# Patient Record
Sex: Female | Born: 1955
Health system: Southern US, Community
[De-identification: ages and names within clinical notes are randomized; demographics above are authoritative.]

## PROBLEM LIST (undated history)

## (undated) DIAGNOSIS — F419 Anxiety disorder, unspecified: Secondary | ICD-10-CM

## (undated) DIAGNOSIS — J45909 Unspecified asthma, uncomplicated: Secondary | ICD-10-CM

## (undated) DIAGNOSIS — E785 Hyperlipidemia, unspecified: Secondary | ICD-10-CM

## (undated) DIAGNOSIS — E039 Hypothyroidism, unspecified: Secondary | ICD-10-CM

## (undated) DIAGNOSIS — I10 Essential (primary) hypertension: Secondary | ICD-10-CM

## (undated) DIAGNOSIS — R0600 Dyspnea, unspecified: Secondary | ICD-10-CM

## (undated) DIAGNOSIS — M1990 Unspecified osteoarthritis, unspecified site: Secondary | ICD-10-CM

## (undated) DIAGNOSIS — J302 Other seasonal allergic rhinitis: Secondary | ICD-10-CM

## (undated) DIAGNOSIS — H269 Unspecified cataract: Secondary | ICD-10-CM

## (undated) DIAGNOSIS — M85851 Other specified disorders of bone density and structure, right thigh: Secondary | ICD-10-CM

## (undated) DIAGNOSIS — R7303 Prediabetes: Secondary | ICD-10-CM

## (undated) HISTORY — DX: Unspecified cataract: H26.9

## (undated) HISTORY — PX: LASIK: SHX215

## (undated) HISTORY — PX: VITRECTOMY: SHX106

## (undated) HISTORY — DX: Hyperlipidemia, unspecified: E78.5

## (undated) HISTORY — PX: BREAST SURGERY: SHX581

## (undated) HISTORY — DX: Essential (primary) hypertension: I10

## (undated) HISTORY — PX: CATARACT EXTRACTION: SUR2

## (undated) HISTORY — DX: Other specified disorders of bone density and structure, right thigh: M85.851

---
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
## 2001-12-06 ENCOUNTER — Encounter: Payer: Self-pay | Admitting: Family Medicine

## 2001-12-06 ENCOUNTER — Encounter: Admission: RE | Admit: 2001-12-06 | Discharge: 2001-12-06 | Payer: Self-pay | Admitting: Family Medicine

## 2003-09-28 ENCOUNTER — Encounter: Admission: RE | Admit: 2003-09-28 | Discharge: 2003-09-28 | Payer: Self-pay | Admitting: Internal Medicine

## 2006-03-21 ENCOUNTER — Encounter: Admission: RE | Admit: 2006-03-21 | Discharge: 2006-03-21 | Payer: Self-pay | Admitting: Obstetrics & Gynecology

## 2008-05-01 ENCOUNTER — Encounter: Admission: RE | Admit: 2008-05-01 | Discharge: 2008-05-01 | Payer: Self-pay | Admitting: Family Medicine

## 2008-05-01 IMAGING — CT CT NECK W/ CM
3 of 4 series · 13 of 20 positions shown, 15 images · IV contrast ([ID] OMNI 300)
Comparison: None available.

CLINICAL DATA: Right posterior cervical swelling and pain times 4
months.

CT NECK WITH CONTRAST
TECHNIQUE: Multidetector CT imaging of the neck was performed with
intravenous contrast.
Contrast: 100 ml Omnipaque 300.

[Series 2: neck w/ · axial · 0.39mm/px · z∈[-107,+77]mm · 8 of 63 slices shown, 10 images]
[im 7/63  soft-tissue]
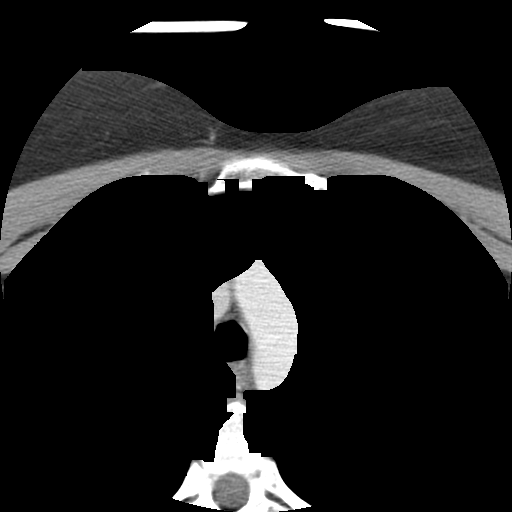
[im 7/63  bone]
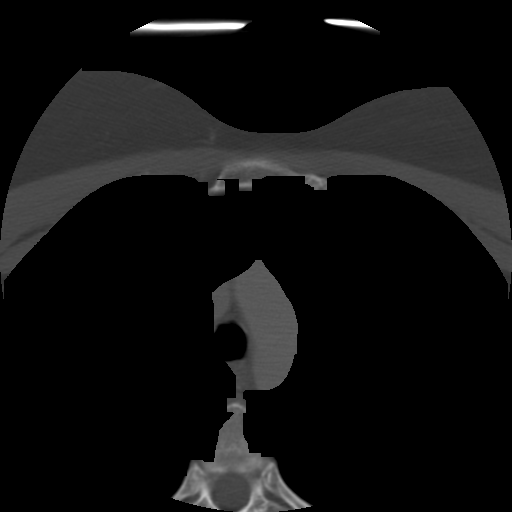
[im 14/63  bone]
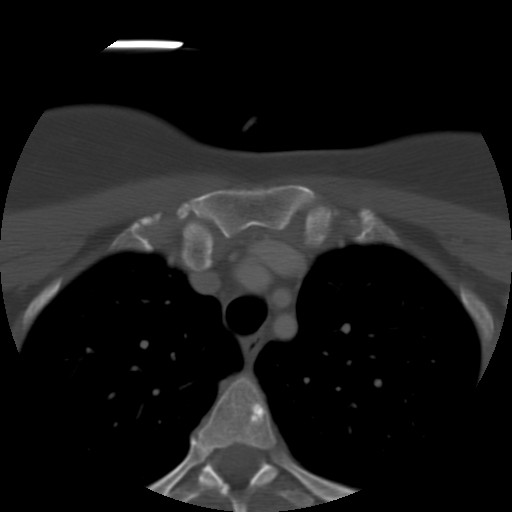
[im 21/63  bone]
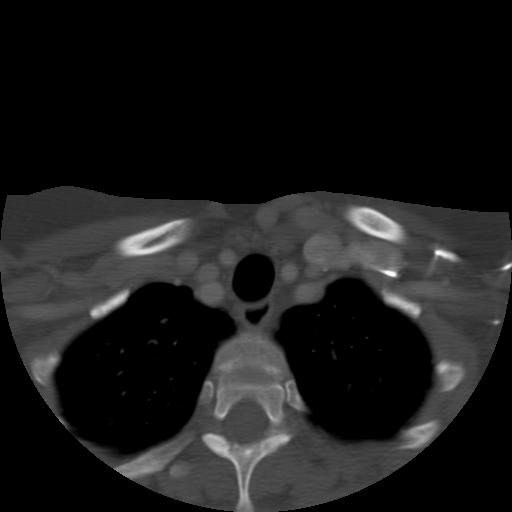
[im 28/63  bone]
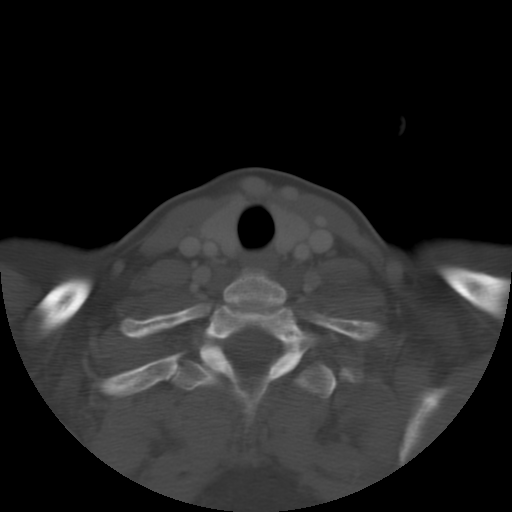
[im 35/63  soft-tissue]
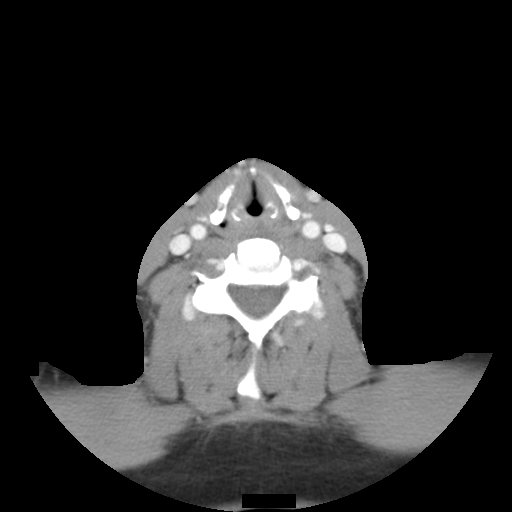
[im 35/63  bone]
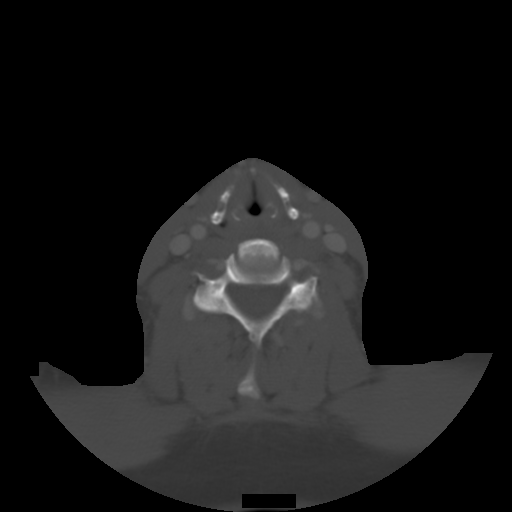
[im 42/63  bone]
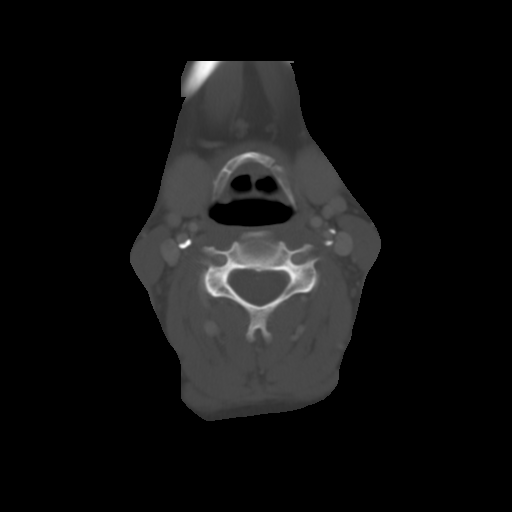
[im 49/63  bone]
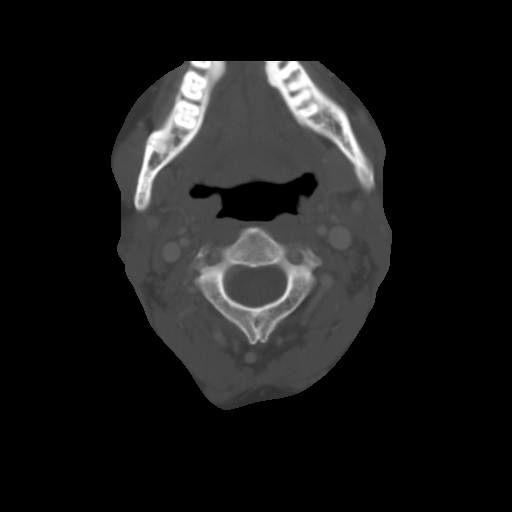
[im 56/63  bone]
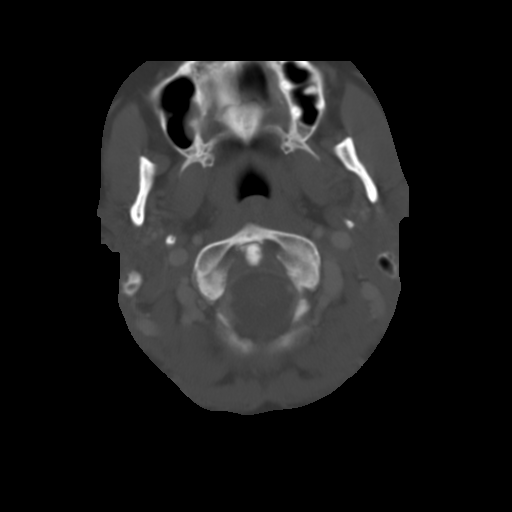

[Series 3: lung windows · axial · 0.53mm/px · z∈[-94,-59]mm · 2 of 23 slices shown]
[im 8/23  bone]
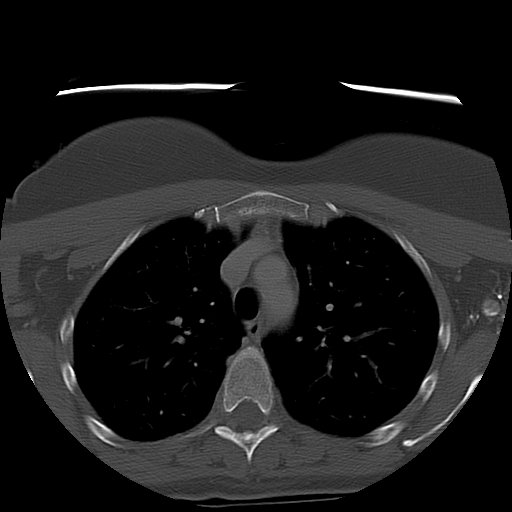
[im 15/23  bone]
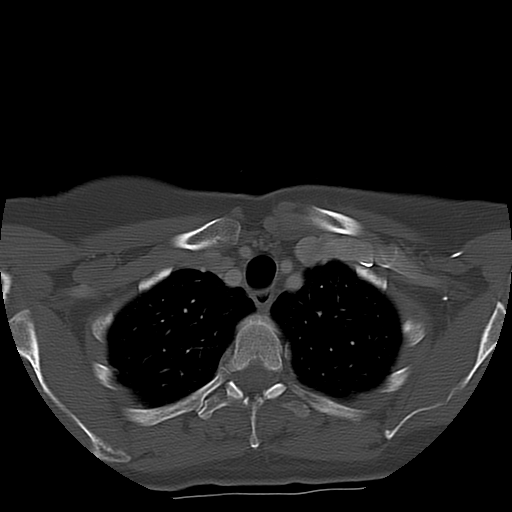

[Series 400: coronal · coronal · 0.47mm/px · 3 of 83 slices shown]
[im 7/83  bone]
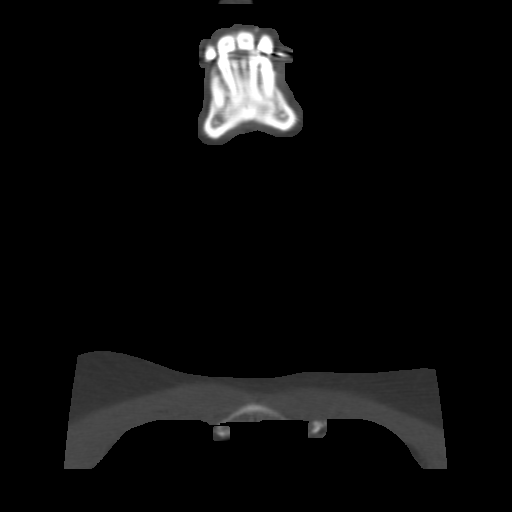
[im 42/83  bone]
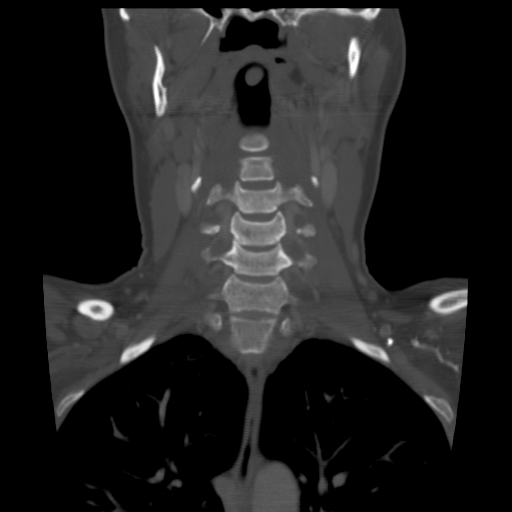
[im 77/83  bone]
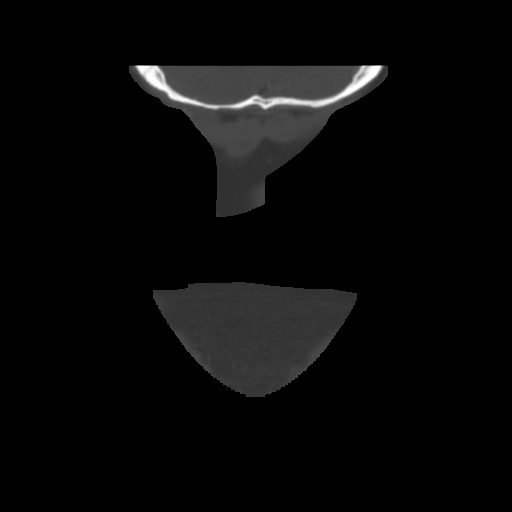

[13 of 20 positions shown; findings below may reference images not displayed]

FINDINGS: No focal mucosal or submucosal lesions are evident.  Rule
prominent level II jugulodigastric nodes are seen bilaterally.
These are likely within normal limits.  Posterior lymph nodes are
small and within normal limits.  There is no focal mass or
significant inflammation.  Muscles are within normal limits.  Mild
degenerative changes are seen in the mid cervical spine.
Atherosclerotic calcification is seen in the proximal internal
carotid arteries bilaterally.  Limited imaging of the brain is
unremarkable.  The lung apices are clear. A well-defined 8 mm
sclerotic lesion is seen within the sternum.  This is mildly
spiculated but contained.  The overlying cortices are normal.
IMPRESSION: 1.  No focal mass or soft tissue induration to explain the findings
or symptoms.
2.  Small lymph nodes are likely within normal limits bilaterally.
3.  Atherosclerotic calcifications within the proximal internal
carotid arteries bilaterally.
4.  Well-defined sclerotic lesion most compatible with a bone
island.

## 2013-06-19 HISTORY — PX: COLONOSCOPY: SHX174

## 2013-10-13 ENCOUNTER — Encounter (HOSPITAL_COMMUNITY): Payer: Self-pay | Admitting: Pharmacy Technician

## 2013-10-22 ENCOUNTER — Ambulatory Visit (HOSPITAL_COMMUNITY): Admission: RE | Admit: 2013-10-22 | Payer: Self-pay | Source: Ambulatory Visit | Admitting: Gastroenterology

## 2013-10-22 ENCOUNTER — Encounter (HOSPITAL_COMMUNITY): Admission: RE | Payer: Self-pay | Source: Ambulatory Visit

## 2013-10-22 SURGERY — COLONOSCOPY WITH PROPOFOL
Anesthesia: Monitor Anesthesia Care

## 2013-10-29 ENCOUNTER — Encounter (HOSPITAL_COMMUNITY): Payer: Self-pay | Admitting: *Deleted

## 2013-11-03 ENCOUNTER — Other Ambulatory Visit: Payer: Self-pay | Admitting: Gastroenterology

## 2013-11-04 ENCOUNTER — Encounter (HOSPITAL_COMMUNITY): Payer: 59 | Admitting: Anesthesiology

## 2013-11-04 ENCOUNTER — Ambulatory Visit (HOSPITAL_COMMUNITY)
Admission: RE | Admit: 2013-11-04 | Discharge: 2013-11-04 | Disposition: A | Discharge status: Home / Self Care | Payer: 59 | Source: Ambulatory Visit | Attending: Gastroenterology | Admitting: Gastroenterology

## 2013-11-04 ENCOUNTER — Encounter (HOSPITAL_COMMUNITY): Payer: Self-pay

## 2013-11-04 ENCOUNTER — Encounter (HOSPITAL_COMMUNITY)
Admission: RE | Disposition: A | Discharge status: Home / Self Care | Payer: Self-pay | Source: Ambulatory Visit | Attending: Gastroenterology

## 2013-11-04 ENCOUNTER — Ambulatory Visit (HOSPITAL_COMMUNITY): Payer: 59 | Admitting: Anesthesiology

## 2013-11-04 DIAGNOSIS — Z87891 Personal history of nicotine dependence: Secondary | ICD-10-CM | POA: Insufficient documentation

## 2013-11-04 DIAGNOSIS — E039 Hypothyroidism, unspecified: Secondary | ICD-10-CM | POA: Insufficient documentation

## 2013-11-04 DIAGNOSIS — Z881 Allergy status to other antibiotic agents status: Secondary | ICD-10-CM | POA: Insufficient documentation

## 2013-11-04 DIAGNOSIS — Z1211 Encounter for screening for malignant neoplasm of colon: Secondary | ICD-10-CM | POA: Insufficient documentation

## 2013-11-04 DIAGNOSIS — Z79899 Other long term (current) drug therapy: Secondary | ICD-10-CM | POA: Insufficient documentation

## 2013-11-04 DIAGNOSIS — Z860100 Personal history of colon polyps, unspecified: Secondary | ICD-10-CM | POA: Insufficient documentation

## 2013-11-04 DIAGNOSIS — Z7982 Long term (current) use of aspirin: Secondary | ICD-10-CM | POA: Insufficient documentation

## 2013-11-04 HISTORY — PX: COLONOSCOPY WITH PROPOFOL: SHX5780

## 2013-11-04 HISTORY — DX: Other seasonal allergic rhinitis: J30.2

## 2013-11-04 HISTORY — DX: Hypothyroidism, unspecified: E03.9

## 2013-11-04 SURGERY — COLONOSCOPY WITH PROPOFOL
Anesthesia: Monitor Anesthesia Care

## 2013-11-04 MED ORDER — SODIUM CHLORIDE 0.9 % IV SOLN: INTRAVENOUS | Status: DC

## 2013-11-04 MED ORDER — LACTATED RINGERS IV SOLN
INTRAVENOUS | Status: DC
  Administered 2013-11-04: 1000 mL via INTRAVENOUS
  Administered 2013-11-04: 08:00:00 via INTRAVENOUS

## 2013-11-04 MED ORDER — MIDAZOLAM HCL 5 MG/5ML IJ SOLN
INTRAMUSCULAR | Status: DC | PRN
  Administered 2013-11-04 (×2): 1 mg via INTRAVENOUS

## 2013-11-04 MED ORDER — MIDAZOLAM HCL 2 MG/2ML IJ SOLN
INTRAMUSCULAR | Status: AC
  Filled 2013-11-04: qty 2

## 2013-11-04 MED ORDER — KETAMINE HCL 10 MG/ML IJ SOLN
INTRAMUSCULAR | Status: DC | PRN
  Administered 2013-11-04: 40 mg via INTRAVENOUS

## 2013-11-04 MED ORDER — PROPOFOL 10 MG/ML IV BOLUS
INTRAVENOUS | Status: AC
Start: 2013-11-04 — End: 2013-11-04
  Filled 2013-11-04: qty 20

## 2013-11-04 MED ORDER — PROPOFOL 10 MG/ML IV BOLUS
INTRAVENOUS | Status: AC
  Filled 2013-11-04: qty 20

## 2013-11-04 MED ORDER — PROPOFOL INFUSION 10 MG/ML OPTIME
INTRAVENOUS | Status: DC | PRN
  Administered 2013-11-04: 140 ug/kg/min via INTRAVENOUS

## 2013-11-04 MED ORDER — FENTANYL CITRATE 0.05 MG/ML IJ SOLN
INTRAMUSCULAR | Status: AC
  Filled 2013-11-04: qty 2

## 2013-11-04 MED ORDER — FENTANYL CITRATE 0.05 MG/ML IJ SOLN
INTRAMUSCULAR | Status: DC | PRN
  Administered 2013-11-04 (×2): 50 ug via INTRAVENOUS

## 2013-11-04 SURGICAL SUPPLY — 22 items

## 2013-11-04 NOTE — Op Note (Signed)
[facility] [place], [address]   COLONOSCOPY PROCEDURE REPORT  PATIENT: Madeline Garza, Madeline Garza  MR#: [id number] BIRTHDATE: 1956-02-26 , 54  yrs. old GENDER: Female ENDOSCOPIST: Ronald Lobo, MD REFERRED BY:   Darcus Austin, MD PROCEDURE DATE:  11/04/2013 PROCEDURE:     colonoscopy ASA CLASS: INDICATIONS:  surveillance for a 6 mm adenomatous polyp removed 5 years ago on the patient's initial screening colonoscopy, which was technically difficult due to to colonic anatomy  MEDICATIONS:    MAC per anesthesia, including propofol, ketamine, fentanyl, and Versed  DESCRIPTION OF PROCEDURE:  The patient came as an outpatient to the [facility] long endoscopy unit and provided written consent. Time out was performed.  The patient received the above sedation and remained stable throughout the procedure.  Based on prior experience, I felt the patient would do best with the Pentax adult video colonoscope, and indeed that scope was advanced all the way to the terminal ileum without significant difficulty.  The terminal ileum looked normal.  The quality of the prep of the colon was excellent it is felt that all areas were well seen.  This was a normal examination. No recurrent polyps, nor any masses, diverticular disease, colitis, or vascular malformations were noted. Retroflexion in the rectum and reinspection of the rectum were unremarkable.  The patient tolerated the procedure well. No biopsies were obtained.     COMPLICATIONS: None  ENDOSCOPIC IMPRESSION:  1.  normal surveillance colonoscopy in a patient with a prior history of a solitary diminutive adenomatous polyp  RECOMMENDATIONS:  1. Per current guidelines for polyp surveillance, this patient should have surveillance colonoscopy 10 years from now    _______________________________ eSigned:  Ronald Lobo, MD 11/04/2013 10:09 AM

## 2013-11-04 NOTE — Transfer of Care (Signed)
Immediate Anesthesia Transfer of Care Note  Patient: Madeline Garza  Procedure(s) Performed: Procedure(s) with comments: COLONOSCOPY WITH PROPOFOL (N/A) - ultraslim scope  Patient Location: PACU  Anesthesia Type:MAC  Level of Consciousness: awake  Airway & Oxygen Therapy: Patient Spontanous Breathing and Patient connected to face mask oxygen  Post-op Assessment: Report given to PACU RN and Post -op Vital signs reviewed and stable  Post vital signs: Reviewed and stable  Complications: No apparent anesthesia complications

## 2013-11-04 NOTE — Addendum Note (Signed)
Addendum created 11/04/13 1200 by Camillia Marcy L Janna Oak, CRNA   Modules edited: Charges VN    

## 2013-11-04 NOTE — Anesthesia Postprocedure Evaluation (Signed)
  Anesthesia Post-op Note  Patient: Madeline Garza  Procedure(s) Performed: Procedure(s) (LRB): COLONOSCOPY WITH PROPOFOL (N/A)  Patient Location: PACU  Anesthesia Type: MAC  Level of Consciousness: awake and alert   Airway and Oxygen Therapy: Patient Spontanous Breathing  Post-op Pain: mild  Post-op Assessment: Post-op Vital signs reviewed, Patient's Cardiovascular Status Stable, Respiratory Function Stable, Patent Airway and No signs of Nausea or vomiting  Last Vitals:  Filed Vitals:   11/04/13 1000  BP: 130/62  Pulse: 71  Temp: 36.4 C  Resp: 18    Post-op Vital Signs: stable   Complications: No apparent anesthesia complications

## 2013-11-04 NOTE — Anesthesia Preprocedure Evaluation (Signed)
Anesthesia Evaluation  Patient identified by MRN, date of birth, ID band Patient awake    Reviewed: Allergy & Precautions, H&P , NPO status , Patient's Chart, lab work & pertinent test results  Airway Mallampati: II TM Distance: >3 FB Neck ROM: Full    Dental no notable dental hx.    Pulmonary neg pulmonary ROS, former smoker,  breath sounds clear to auscultation  Pulmonary exam normal       Cardiovascular negative cardio ROS  Rhythm:Regular Rate:Normal     Neuro/Psych negative neurological ROS  negative psych ROS   GI/Hepatic negative GI ROS, Neg liver ROS,   Endo/Other  Hypothyroidism   Renal/GU negative Renal ROS  negative genitourinary   Musculoskeletal negative musculoskeletal ROS (+)   Abdominal   Peds negative pediatric ROS (+)  Hematology negative hematology ROS (+)   Anesthesia Other Findings   Reproductive/Obstetrics negative OB ROS                           Anesthesia Physical Anesthesia Plan  ASA: II  Anesthesia Plan: MAC   Post-op Pain Management:    Induction: Intravenous  Airway Management Planned: Nasal Cannula  Additional Equipment:   Intra-op Plan:   Post-operative Plan:   Informed Consent: I have reviewed the patients History and Physical, chart, labs and discussed the procedure including the risks, benefits and alternatives for the proposed anesthesia with the patient or authorized representative who has indicated his/her understanding and acceptance.     Plan Discussed with: CRNA and Surgeon  Anesthesia Plan Comments:         Anesthesia Quick Evaluation

## 2013-11-04 NOTE — H&P (Signed)
Madeline Garza is an 58 y.o. female.   Chief Complaint: History of colon polyp HPI: This 58 year old female is 5 years status post removal of a 6 mm benign adenoma from the transverse colon. She presents now for routine surveillance colonoscopy. The procedure is being done at the hospital because of technical difficulty getting around her colon due to colonic anatomy, including fixation and looping, at the time of her last procedure. It was felt that propofol sedation, with potential need for the ultraslim colonoscope, would improve the chances of getting around the colon more easily  Past Medical History  Diagnosis Date  . Hypothyroidism   . Seasonal allergies     Past Surgical History  Procedure Laterality Date  . Breast surgery      age 85 breast cyst-benign  . Lasik      History reviewed. No pertinent family history. Social History:  reports that she quit smoking about 5 years ago. Her smoking use included Cigarettes. She smoked 0.00 packs per day. She does not have any smokeless tobacco history on file. She reports that she drinks alcohol. She reports that she does not use illicit drugs.  Allergies:  Allergies  Allergen Reactions  . Tetracyclines & Related Nausea And Vomiting    Medications Prior to Admission  Medication Sig Dispense Refill  . aspirin EC 81 MG tablet Take 81 mg by mouth daily.      . B Complex-C (B-COMPLEX WITH VITAMIN C) tablet Take 1 tablet by mouth daily.      . cholecalciferol (VITAMIN D) 1000 UNITS tablet Take 1,000 Units by mouth daily.      Marland Kitchen KRILL OIL PO Take 1 capsule by mouth daily.       Marland Kitchen levothyroxine (SYNTHROID, LEVOTHROID) 25 MCG tablet Take 25 mcg by mouth daily before breakfast.      . Multiple Vitamin (MULTIVITAMIN WITH MINERALS) TABS tablet Take 1 tablet by mouth daily.      Marland Kitchen Bioflavonoid Products (GRAPE SEED PO) Take 1 tablet by mouth daily.        No results found for this or any previous visit (from the past 48 hour(s)). No results  found.  ROS negative  Blood pressure 132/59, pulse 60, temperature 97.7 F (36.5 C), temperature source Oral, resp. rate 18, height 5\' 4"  (1.626 m), weight 69.4 kg (153 lb), SpO2 100.00%. Physical Exam a healthy appearing patient in no evident distress. The chest is clear. The heart is without murmurs or arrhythmias. The abdomen is soft, nontender, and without guarding or masses. Oropharynx benign  Assessment/Plan History of colonic adenoma, now due for surveillance.  Plan: Colonoscopy under propofol sedation. We will start with the adult colonoscope and potentially move to the ultraslim colonoscope if needed.  Youlanda Mighty Maythe Deramo 11/04/2013, 9:22 AM

## 2013-11-05 ENCOUNTER — Encounter (HOSPITAL_COMMUNITY): Payer: Self-pay | Admitting: Gastroenterology

## 2014-05-28 ENCOUNTER — Ambulatory Visit (INDEPENDENT_AMBULATORY_CARE_PROVIDER_SITE_OTHER): Payer: 59 | Admitting: Obstetrics and Gynecology

## 2014-05-28 ENCOUNTER — Encounter: Payer: Self-pay | Admitting: Obstetrics and Gynecology

## 2014-05-28 VITALS — BP 110/62 | HR 80 | Resp 16 | Ht 64.25 in | Wt 165.0 lb

## 2014-05-28 DIAGNOSIS — Z0000 Encounter for general adult medical examination without abnormal findings: Secondary | ICD-10-CM

## 2014-05-28 DIAGNOSIS — Z01419 Encounter for gynecological examination (general) (routine) without abnormal findings: Secondary | ICD-10-CM

## 2014-05-28 LAB — POCT URINALYSIS DIPSTICK
Bilirubin, UA: NEGATIVE
Blood, UA: NEGATIVE
GLUCOSE UA: NEGATIVE
Ketones, UA: NEGATIVE
LEUKOCYTES UA: NEGATIVE
Nitrite, UA: NEGATIVE
PROTEIN UA: NEGATIVE
UROBILINOGEN UA: NEGATIVE
pH, UA: 6

## 2014-05-28 NOTE — Patient Instructions (Signed)
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
## 2014-05-31 LAB — IPS PAP TEST WITH HPV

## 2015-06-02 ENCOUNTER — Ambulatory Visit: Payer: 59 | Admitting: Obstetrics and Gynecology

## 2015-07-15 ENCOUNTER — Ambulatory Visit: Payer: 59 | Admitting: Obstetrics and Gynecology

## 2015-08-24 ENCOUNTER — Emergency Department (HOSPITAL_COMMUNITY)
Admission: EM | Admit: 2015-08-24 | Discharge: 2015-08-24 | Disposition: A | Discharge status: Home / Self Care | Payer: Self-pay | Attending: Emergency Medicine | Admitting: Emergency Medicine

## 2015-08-24 ENCOUNTER — Encounter (HOSPITAL_COMMUNITY): Payer: Self-pay | Admitting: *Deleted

## 2015-08-24 DIAGNOSIS — I10 Essential (primary) hypertension: Secondary | ICD-10-CM | POA: Insufficient documentation

## 2015-08-24 DIAGNOSIS — Z87891 Personal history of nicotine dependence: Secondary | ICD-10-CM | POA: Insufficient documentation

## 2015-08-24 DIAGNOSIS — Z7982 Long term (current) use of aspirin: Secondary | ICD-10-CM | POA: Insufficient documentation

## 2015-08-24 DIAGNOSIS — R002 Palpitations: Secondary | ICD-10-CM | POA: Insufficient documentation

## 2015-08-24 DIAGNOSIS — Z79899 Other long term (current) drug therapy: Secondary | ICD-10-CM | POA: Insufficient documentation

## 2015-08-24 DIAGNOSIS — E039 Hypothyroidism, unspecified: Secondary | ICD-10-CM | POA: Insufficient documentation

## 2015-08-24 DIAGNOSIS — Z87828 Personal history of other (healed) physical injury and trauma: Secondary | ICD-10-CM | POA: Insufficient documentation

## 2015-08-24 LAB — BASIC METABOLIC PANEL
Anion gap: 16 — ABNORMAL HIGH (ref 5–15)
BUN: 8 mg/dL (ref 6–20)
CO2: 26 mmol/L (ref 22–32)
Calcium: 9.7 mg/dL (ref 8.9–10.3)
Chloride: 92 mmol/L — ABNORMAL LOW (ref 101–111)
Creatinine, Ser: 0.61 mg/dL (ref 0.44–1.00)
GFR calc Af Amer: >60 mL/min (ref >60)
GFR calc non Af Amer: >60 mL/min (ref >60)
Glucose, Bld: 103 mg/dL — ABNORMAL HIGH (ref 65–99)
Potassium: 2.9 mmol/L — ABNORMAL LOW (ref 3.5–5.1)
Sodium: 134 mmol/L — ABNORMAL LOW (ref 135–145)

## 2015-08-24 LAB — URINALYSIS, ROUTINE W REFLEX MICROSCOPIC
Bilirubin Urine: NEGATIVE
Glucose, UA: NEGATIVE mg/dL
Hgb urine dipstick: NEGATIVE
Ketones, ur: 15 mg/dL — AB
Leukocytes, UA: NEGATIVE
Nitrite: NEGATIVE
Protein, ur: NEGATIVE mg/dL
Specific Gravity, Urine: 1.01 (ref 1.005–1.030)
pH: 6.5 (ref 5.0–8.0)

## 2015-08-24 LAB — CBC WITH DIFFERENTIAL/PLATELET
Basophils Absolute: 0 10*3/uL (ref 0.0–0.1)
Basophils Relative: 0 %
Eosinophils Absolute: 0 10*3/uL (ref 0.0–0.7)
Eosinophils Relative: 0 %
HCT: 41.2 % (ref 36.0–46.0)
Hemoglobin: 14 g/dL (ref 12.0–15.0)
Lymphocytes Relative: 25 %
Lymphs Abs: 1.3 10*3/uL (ref 0.7–4.0)
MCH: 30.1 pg (ref 26.0–34.0)
MCHC: 34 g/dL (ref 30.0–36.0)
MCV: 88.6 fL (ref 78.0–100.0)
Monocytes Absolute: 0.4 10*3/uL (ref 0.1–1.0)
Monocytes Relative: 8 %
Neutro Abs: 3.5 10*3/uL (ref 1.7–7.7)
Neutrophils Relative %: 67 %
Platelets: 288 10*3/uL (ref 150–400)
RBC: 4.65 MIL/uL (ref 3.87–5.11)
RDW: 12.9 % (ref 11.5–15.5)
WBC: 5.2 10*3/uL (ref 4.0–10.5)

## 2015-08-24 MED ORDER — POTASSIUM CHLORIDE ER 10 MEQ PO TBCR: 10.0000 meq | EXTENDED_RELEASE_TABLET | Freq: Every day | ORAL | Status: DC

## 2015-08-24 MED ORDER — POTASSIUM CHLORIDE CRYS ER 20 MEQ PO TBCR
60.0000 meq | EXTENDED_RELEASE_TABLET | Freq: Once | ORAL | Status: AC
  Administered 2015-08-24: 60 meq via ORAL
  Filled 2015-08-24: qty 3
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
## 2015-08-24 NOTE — ED Notes (Signed)
Pt was at work and began to have palpitations. She checked her BP it was over [id number] systolic.  She called EMS to bring her to ED.  Pt was only taking synthroid until Saturday when she was prescribed  hydrochlorothiazide for Hypertension, dizziness, nausea, and headaches. She has felt fine until the past 2 days.  Today was the worst day so she came in for evaluation.  Per EMS vital signs are as follows: BP: 192/92 HR: 96 Resp: 16 SPO2 99% on RA

## 2015-08-24 NOTE — Discharge Instructions (Signed)
Return here as needed.  Follow-up with your primary care doctor is soon as possible

## 2015-08-24 NOTE — ED Provider Notes (Signed)
  Face-to-face evaluation   History: Intermittent palpitations, and hypertension. Recently started on hydrocodone thiazide  Physical exam: Alert, calm, cooperative. Heart regular rate and rhythm. No murmur. Lungs clear anteriorly. Normal pulse, right wrist, rate 100.  Medical screening examination/treatment/procedure(s) were conducted as a shared visit with non-physician practitioner(s) and myself.  I personally evaluated the patient during the encounter  Daleen Bo, MD 08/28/15 2142

## 2015-09-16 ENCOUNTER — Encounter: Payer: Self-pay | Admitting: Cardiology

## 2015-11-16 ENCOUNTER — Encounter: Payer: Self-pay | Admitting: Cardiology

## 2015-11-16 ENCOUNTER — Ambulatory Visit (INDEPENDENT_AMBULATORY_CARE_PROVIDER_SITE_OTHER): Payer: Self-pay | Admitting: Cardiology

## 2015-11-16 VITALS — BP 152/80 | HR 63 | Ht 64.0 in | Wt 154.4 lb

## 2015-11-16 DIAGNOSIS — R002 Palpitations: Secondary | ICD-10-CM

## 2015-11-16 DIAGNOSIS — Z8249 Family history of ischemic heart disease and other diseases of the circulatory system: Secondary | ICD-10-CM

## 2015-11-16 DIAGNOSIS — E876 Hypokalemia: Secondary | ICD-10-CM

## 2015-11-16 DIAGNOSIS — I10 Essential (primary) hypertension: Secondary | ICD-10-CM

## 2015-11-16 LAB — BASIC METABOLIC PANEL
BUN: 9 mg/dL (ref 7–25)
CO2: 29 mmol/L (ref 20–31)
CREATININE: 0.68 mg/dL (ref 0.50–1.05)
Calcium: 9.7 mg/dL (ref 8.6–10.4)
Chloride: 104 mmol/L (ref 98–110)
Glucose, Bld: 86 mg/dL (ref 65–99)
Potassium: 4.4 mmol/L (ref 3.5–5.3)
Sodium: 140 mmol/L (ref 135–146)

## 2015-11-16 NOTE — Patient Instructions (Signed)
Medication Instructions:  The current medical regimen is effective;  continue present plan and medications.  Labwork: Please have blood work today (BMP)  Testing/Procedures: Your physician has recommended that you wear an event monitor for 30 days. Event monitors are medical devices that record the heart's electrical activity. Doctors most often [place] these monitors to diagnose arrhythmias. Arrhythmias are problems with the speed or rhythm of the heartbeat. The monitor is a small, portable device. You can wear one while you do your normal daily activities. This is usually used to diagnose what is causing palpitations/syncope (passing out).  Follow-Up: Follow up in approximately 6 weeks.  If you need a refill on your cardiac medications before your next appointment, please call your pharmacy.  Thank you for choosing [facility]!!
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
## 2015-11-23 ENCOUNTER — Telehealth: Payer: Self-pay | Admitting: *Deleted

## 2015-11-23 ENCOUNTER — Telehealth: Payer: Self-pay | Admitting: Cardiology

## 2015-11-23 NOTE — Telephone Encounter (Signed)
Spoke with pt and advised that I did receive paperwork she dropped off from [facility] ED for evaluation of weakness.  She reports going hiking in [place] this weekend and began having nausea, had stopped sweating and was having diarrhea, per pt - all the classic signs of heat exhaustion.  Her friend took her the ED for further eval - pt had blood work and an EKG.  She reports receiving IV fluids.  We did receive results of TSH, BMP and CBC.  Results were nl except for NA 131, Ch 96 and BS 129.  We did not receive a copy of the EKG.  She reports the MD there told her she needs to notify her cardiologist of her treatment.  Advised pt we need more records than just the lab results to be able to know what occurred while she was there.  She is going to contact the hospital and have them send more records.  Since being back home she reports continuing to feel weakness, has had headaches, diarrhea and a lot of anxiety.  She reports having the most trouble with anxiety and that it didn't start until after starting her medications a month ago.  When she saw Dr Marlou Porch he ordered for her to wear a 30 day event monitor.  This has not been done yet.  She reports turning in her paperwork for it last Thursday or Friday.  Advised she should f/u with her PCP RE: anxiety.

## 2015-11-23 NOTE — Telephone Encounter (Signed)
Walk in pt form-pt dropped off records from [facility] like a call from Nurse gave to [facility]

## 2015-12-01 ENCOUNTER — Telehealth: Payer: Self-pay | Admitting: *Deleted

## 2015-12-01 ENCOUNTER — Ambulatory Visit (INDEPENDENT_AMBULATORY_CARE_PROVIDER_SITE_OTHER): Payer: Self-pay

## 2015-12-01 DIAGNOSIS — R002 Palpitations: Secondary | ICD-10-CM

## 2015-12-01 NOTE — Telephone Encounter (Signed)
Received notification from [facility] , she has not begun the monitoring process as prescribed.  Please call [facility] at [phone], press option 1, to let them know you have received the monitor and plan to apply and baseline.

## 2015-12-10 NOTE — Telephone Encounter (Signed)
Never received EKGs from hospitalization.  Pt is now wearing an event monitor.  Will f/u after monitor results obtained.

## 2016-01-21 ENCOUNTER — Telehealth: Payer: Self-pay | Admitting: Cardiology

## 2016-01-21 NOTE — Telephone Encounter (Signed)
PT  AWARE OF  MONITOR RESULTS ./CY 

## 2016-01-21 NOTE — Telephone Encounter (Signed)
Follow-up      The pt is calling to go over test from her heart monitor

## 2016-02-18 ENCOUNTER — Encounter: Payer: Self-pay | Admitting: Cardiology

## 2016-03-13 ENCOUNTER — Telehealth: Payer: Self-pay | Admitting: Physician Assistant

## 2016-03-13 ENCOUNTER — Telehealth: Payer: Self-pay | Admitting: Cardiology

## 2016-03-13 NOTE — Telephone Encounter (Signed)
Madeline Garza is calling because she states that her blood pressure and pulse has been elevated since yesterday afternoon . She is not sure if she needs to come in to get checked  Or what . She is wanting to speak with his nurse

## 2016-03-13 NOTE — Telephone Encounter (Signed)
NA -lm on VM to c/b to discuss.

## 2016-03-13 NOTE — Telephone Encounter (Signed)
According to the patient, she was supposed to fly out today when she was noted her blood pressure was in the 190s and she was very dizzy. She later asked her friend to take her back from [place] to [place] area. Her right now her blood pressure is still 180s. I have instructed her to take additional half a tablet of metoprolol. She will check her blood pressure 2 hours from now. She says she has a little bit headache which she attributed to the blood pressure issue. I have instructed her to monitor the headache, if it does get worse, she will need to discuss with her doctor in case there is any intracranial etiology going on. Otherwise, I have asked her to keep a blood pressure diary for the next few days, and less inclined to permanently adjust her medication based on this single day of the event.  Hilbert Corrigan PA Pager: [phone]

## 2016-03-14 NOTE — Telephone Encounter (Signed)
Telephone Encounter Encounter Date: 03/13/2016 Almyra Deforest, PA  Cardiology    [] Hide copied text [] Hover for attribution information According to the patient, she was supposed to fly out today when she was noted her blood pressure was in the 190s and she was very dizzy. She later asked her friend to take her back from [place] to [place] area. Her right now her blood pressure is still 180s. I have instructed her to take additional half a tablet of metoprolol. She will check her blood pressure 2 hours from now. She says she has a little bit headache which she attributed to the blood pressure issue. I have instructed her to monitor the headache, if it does get worse, she will need to discuss with her doctor in case there is any intracranial etiology going on. Otherwise, I have asked her to keep a blood pressure diary for the next few days, and less inclined to permanently adjust her medication based on this single day of the event.  Hilbert Corrigan PA Pager: [phone]

## 2016-03-14 NOTE — Telephone Encounter (Signed)
Spoke with pt who is reporting she took extra 1/2 tablet of Metoprolol last night as instructed and BP was much better at 9 pm.  This morning about 2:30 am she woke up worried about somethings and it was elevated.  This morning it was 105/60 HR 55.  She took her regular dose of medication today.  Advised to continue to monitor BP and c/b if further issues.  She thanked me for following up.

## 2016-06-01 LAB — LIPID PANEL: LDL CALC: 150 mg/dL

## 2016-06-01 LAB — VITAMIN D 25 HYDROXY (VIT D DEFICIENCY, FRACTURES): VIT D 25 HYDROXY: 30.8

## 2016-06-01 LAB — TSH: TSH: 4.78 u[IU]/mL (ref 0.41–5.90)

## 2016-08-22 ENCOUNTER — Encounter: Payer: Self-pay | Admitting: Endocrinology

## 2016-08-22 ENCOUNTER — Ambulatory Visit (INDEPENDENT_AMBULATORY_CARE_PROVIDER_SITE_OTHER): Payer: BLUE CROSS/BLUE SHIELD | Admitting: Endocrinology

## 2016-08-22 VITALS — BP 152/70 | HR 65 | Ht 64.0 in | Wt 164.0 lb

## 2016-08-22 DIAGNOSIS — E038 Other specified hypothyroidism: Secondary | ICD-10-CM

## 2016-08-22 DIAGNOSIS — E063 Autoimmune thyroiditis: Secondary | ICD-10-CM | POA: Insufficient documentation

## 2016-08-22 DIAGNOSIS — I10 Essential (primary) hypertension: Secondary | ICD-10-CM

## 2016-08-22 DIAGNOSIS — R5383 Other fatigue: Secondary | ICD-10-CM

## 2016-08-22 DIAGNOSIS — R002 Palpitations: Secondary | ICD-10-CM

## 2016-08-22 DIAGNOSIS — R635 Abnormal weight gain: Secondary | ICD-10-CM | POA: Diagnosis not present

## 2016-08-22 MED ORDER — LIOTHYRONINE SODIUM 5 MCG PO TABS: 5.0000 ug | ORAL_TABLET | Freq: Every day | ORAL | 2 refills | Status: DC

## 2016-08-22 MED ORDER — SYNTHROID 75 MCG PO TABS: ORAL_TABLET | ORAL | 3 refills | Status: DC
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
## 2016-09-14 ENCOUNTER — Encounter: Payer: Self-pay | Admitting: Cardiology

## 2016-09-17 ENCOUNTER — Other Ambulatory Visit: Payer: Self-pay | Admitting: Cardiology

## 2016-09-18 ENCOUNTER — Other Ambulatory Visit: Payer: Self-pay | Admitting: *Deleted

## 2016-09-18 MED ORDER — METOPROLOL SUCCINATE ER 50 MG PO TB24: ORAL_TABLET | ORAL | 0 refills | Status: DC

## 2016-09-18 NOTE — Telephone Encounter (Signed)
Refill on metoprolol sent to [facility], [place] per patients request.

## 2016-09-19 ENCOUNTER — Other Ambulatory Visit: Payer: Self-pay | Admitting: Cardiology

## 2016-09-19 NOTE — Telephone Encounter (Signed)
Juventino Slovak, CMA at 09/18/2016 10:29 AM   Status: Signed    Refill on metoprolol sent to [facility], [place] per patients request.    Encounter MyChart Messages   No messages in this encounter  Approved    Disp Refills Start End  metoprolol succinate (TOPROL-XL) 50 MG 24 hr tablet 45 tablet 0 09/18/2016   Sig:  Take 25 mg by mouth daily.  Class:  Normal  DAW:  No  Authorizing Provider:  Jerline Pain, MD  Ordering User:  Juventino Slovak, [place] [phone] IN [employer] [facility] [place], [place]

## 2016-09-28 ENCOUNTER — Encounter: Payer: Self-pay | Admitting: Cardiology

## 2016-09-28 ENCOUNTER — Ambulatory Visit (INDEPENDENT_AMBULATORY_CARE_PROVIDER_SITE_OTHER): Payer: BLUE CROSS/BLUE SHIELD | Admitting: Cardiology

## 2016-09-28 ENCOUNTER — Encounter (INDEPENDENT_AMBULATORY_CARE_PROVIDER_SITE_OTHER): Payer: Self-pay

## 2016-09-28 VITALS — BP 122/76 | HR 64 | Resp 16 | Ht 64.0 in | Wt 160.5 lb

## 2016-09-28 DIAGNOSIS — Z8249 Family history of ischemic heart disease and other diseases of the circulatory system: Secondary | ICD-10-CM

## 2016-09-28 DIAGNOSIS — I10 Essential (primary) hypertension: Secondary | ICD-10-CM | POA: Diagnosis not present

## 2016-09-28 DIAGNOSIS — E876 Hypokalemia: Secondary | ICD-10-CM

## 2016-09-28 DIAGNOSIS — R002 Palpitations: Secondary | ICD-10-CM | POA: Diagnosis not present

## 2016-09-28 MED ORDER — LISINOPRIL 5 MG PO TABS: 5.0000 mg | ORAL_TABLET | Freq: Every day | ORAL | 3 refills | Status: DC

## 2016-09-28 MED ORDER — METOPROLOL SUCCINATE ER 50 MG PO TB24: ORAL_TABLET | ORAL | 3 refills | Status: DC

## 2016-09-28 NOTE — Patient Instructions (Addendum)
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
## 2016-09-29 ENCOUNTER — Other Ambulatory Visit: Payer: BLUE CROSS/BLUE SHIELD

## 2016-09-29 NOTE — Addendum Note (Signed)
Addended by: Shellia Cleverly on: 09/29/2016 09:02 AM   Modules accepted: Orders

## 2016-10-03 ENCOUNTER — Ambulatory Visit: Payer: BLUE CROSS/BLUE SHIELD | Admitting: Endocrinology

## 2016-10-03 ENCOUNTER — Telehealth: Payer: Self-pay | Admitting: Endocrinology

## 2016-10-03 NOTE — Telephone Encounter (Signed)
Patient no showed today's appt. Please advise on how to follow up. °A. No follow up necessary. °B. Follow up urgent. Contact patient immediately. °C. Follow up necessary. Contact patient and schedule visit in ___ days. °D. Follow up advised. Contact patient and schedule visit in ____weeks. ° °

## 2016-10-04 NOTE — Telephone Encounter (Signed)
She needs to be scheduled for both lab work and office visit as soon as possible

## 2016-10-19 ENCOUNTER — Other Ambulatory Visit (INDEPENDENT_AMBULATORY_CARE_PROVIDER_SITE_OTHER): Payer: BLUE CROSS/BLUE SHIELD

## 2016-10-19 DIAGNOSIS — E063 Autoimmune thyroiditis: Secondary | ICD-10-CM

## 2016-10-19 LAB — TSH: TSH: 2.08 u[IU]/mL (ref 0.35–4.50)

## 2016-10-19 LAB — T3, FREE: T3 FREE: 3.6 pg/mL (ref 2.3–4.2)

## 2016-10-19 LAB — T4, FREE: FREE T4: 0.78 ng/dL (ref 0.60–1.60)

## 2016-10-23 ENCOUNTER — Encounter: Payer: Self-pay | Admitting: Endocrinology

## 2016-10-23 ENCOUNTER — Ambulatory Visit (INDEPENDENT_AMBULATORY_CARE_PROVIDER_SITE_OTHER): Payer: BLUE CROSS/BLUE SHIELD | Admitting: Endocrinology

## 2016-10-23 VITALS — BP 130/78 | HR 66 | Ht 64.0 in | Wt 164.0 lb

## 2016-10-23 DIAGNOSIS — E063 Autoimmune thyroiditis: Secondary | ICD-10-CM
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
## 2016-10-30 ENCOUNTER — Other Ambulatory Visit: Payer: Self-pay

## 2016-10-30 MED ORDER — SYNTHROID 75 MCG PO TABS: ORAL_TABLET | ORAL | 3 refills | Status: DC

## 2016-11-03 ENCOUNTER — Telehealth: Payer: Self-pay | Admitting: Cardiology

## 2016-11-03 NOTE — Telephone Encounter (Signed)
Left message for patient to call back  

## 2016-11-03 NOTE — Telephone Encounter (Signed)
New message   Pt c/o BP issue: STAT if pt c/o blurred vision, one-sided weakness or slurred speech  1. What are your last 5 BP readings? 145/72 pulse 65, 161/87 pulse 75, 165/92 pulse 70  2. Are you having any other symptoms (ex. Dizziness, headache, blurred vision, passed out)? no  3. What is your BP issue? bp is up and down and pulse fluctuated

## 2016-11-03 NOTE — Telephone Encounter (Signed)
Returned call to patient-patient reports she has not felt good for the last 2 days due to fluctuation in HR and BP.  Reports yesterday she woke up with a HA and minor SOB.  BP readings as follows:  5/17 PM 165/92 161/87 HR 75  5/18 AM 119/67 HR 57 106/76 HR 70  122/73 HR 65  Reports HR increases to 80-90s when she ambulates to another room.  Reports she doesn't understand why BP and HR is fluctuating so much and is making her feel bad, did not work yesterday or today.  Takes her metoprolol in the AM and lisinopril in the evenings. Denies CP, SOB, blurred vision, dizziness today. Wondering if Dr. Marlou Porch had any recommendations.  Advised I would route to Dr. Marlou Porch for further recommendations.

## 2016-11-03 NOTE — Telephone Encounter (Signed)
Follow up message     Pt is returning Pams call

## 2016-11-06 NOTE — Telephone Encounter (Signed)
NA

## 2016-11-06 NOTE — Telephone Encounter (Signed)
One thing that we can  try is to take Toprol-XL 25 mg both in the morning and in the evening (a total of 50 mg a day.) Perhaps this will help even out her blood pressures and heart rate.  Otherwise, the recordings that have been written down for certainly not in any dangerous range and it is not uncommon for blood pressures as well as heart rates to fluctuate throughout the day.  Candee Furbish, MD

## 2016-11-08 NOTE — Telephone Encounter (Signed)
Again NA/No VM.

## 2016-11-10 NOTE — Telephone Encounter (Signed)
Pt is aware to take metoprolol succinate 50 mg 1/2 tablet twice a day.  She will call back with how it is working for her and when she needs a refill.

## 2016-11-21 ENCOUNTER — Other Ambulatory Visit: Payer: Self-pay | Admitting: Endocrinology

## 2017-02-26 ENCOUNTER — Other Ambulatory Visit: Payer: BLUE CROSS/BLUE SHIELD

## 2017-03-02 ENCOUNTER — Ambulatory Visit: Payer: BLUE CROSS/BLUE SHIELD | Admitting: Endocrinology

## 2017-03-09 ENCOUNTER — Ambulatory Visit: Payer: Self-pay | Admitting: Endocrinology

## 2017-03-12 ENCOUNTER — Other Ambulatory Visit: Payer: Self-pay | Admitting: Endocrinology

## 2017-04-10 ENCOUNTER — Other Ambulatory Visit: Payer: Self-pay | Admitting: Endocrinology

## 2017-04-19 ENCOUNTER — Ambulatory Visit: Payer: Self-pay | Admitting: Endocrinology

## 2017-04-27 ENCOUNTER — Other Ambulatory Visit: Payer: Self-pay

## 2017-04-27 MED ORDER — SYNTHROID 75 MCG PO TABS: 37.5000 ug | ORAL_TABLET | Freq: Every day | ORAL | 2 refills | Status: DC

## 2017-05-23 ENCOUNTER — Other Ambulatory Visit: Payer: Self-pay

## 2017-05-23 MED ORDER — LIOTHYRONINE SODIUM 5 MCG PO TABS: 5.0000 ug | ORAL_TABLET | Freq: Every day | ORAL | 0 refills | Status: DC

## 2017-05-31 ENCOUNTER — Other Ambulatory Visit: Payer: Self-pay

## 2017-05-31 MED ORDER — SYNTHROID 75 MCG PO TABS: 37.5000 ug | ORAL_TABLET | Freq: Every day | ORAL | 2 refills | Status: DC

## 2017-06-06 ENCOUNTER — Other Ambulatory Visit: Payer: Self-pay

## 2017-06-06 MED ORDER — SYNTHROID 75 MCG PO TABS: 37.5000 ug | ORAL_TABLET | Freq: Every day | ORAL | 2 refills | Status: DC

## 2017-06-21 ENCOUNTER — Other Ambulatory Visit: Payer: Self-pay | Admitting: Endocrinology

## 2017-07-21 ENCOUNTER — Other Ambulatory Visit: Payer: Self-pay | Admitting: Endocrinology

## 2017-07-29 ENCOUNTER — Other Ambulatory Visit: Payer: Self-pay | Admitting: Endocrinology

## 2017-08-02 ENCOUNTER — Other Ambulatory Visit: Payer: Self-pay

## 2017-08-06 ENCOUNTER — Other Ambulatory Visit: Payer: Self-pay

## 2017-08-06 MED ORDER — LIOTHYRONINE SODIUM 5 MCG PO TABS: 5.0000 ug | ORAL_TABLET | Freq: Every day | ORAL | 0 refills | Status: DC

## 2017-08-07 ENCOUNTER — Other Ambulatory Visit: Payer: Self-pay

## 2017-08-07 MED ORDER — LIOTHYRONINE SODIUM 5 MCG PO TABS: 5.0000 ug | ORAL_TABLET | Freq: Every day | ORAL | 0 refills | Status: DC

## 2017-08-07 MED ORDER — SYNTHROID 75 MCG PO TABS: 37.5000 ug | ORAL_TABLET | Freq: Every day | ORAL | 0 refills | Status: DC

## 2017-08-09 ENCOUNTER — Other Ambulatory Visit: Payer: Self-pay

## 2017-08-09 MED ORDER — LIOTHYRONINE SODIUM 5 MCG PO TABS: 5.0000 ug | ORAL_TABLET | Freq: Every day | ORAL | 0 refills | Status: DC

## 2017-08-13 ENCOUNTER — Other Ambulatory Visit: Payer: Self-pay | Admitting: Cardiology

## 2017-08-13 MED ORDER — METOPROLOL SUCCINATE ER 50 MG PO TB24: ORAL_TABLET | ORAL | 0 refills | Status: DC

## 2017-08-19 ENCOUNTER — Other Ambulatory Visit: Payer: Self-pay | Admitting: Cardiology

## 2017-08-22 ENCOUNTER — Encounter: Payer: Self-pay | Admitting: Endocrinology

## 2017-08-22 LAB — T3, FREE: T3, Free: 3.1 pg/mL (ref 2.3–4.2)

## 2017-08-22 LAB — TSH: TSH: 2.78 u[IU]/mL (ref 0.35–4.50)

## 2017-08-22 LAB — T4, FREE: FREE T4: 0.71 ng/dL (ref 0.60–1.60)
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
## 2017-08-23 NOTE — Progress Notes (Signed)
This encounter was created in error - please disregard.

## 2017-08-24 ENCOUNTER — Ambulatory Visit: Payer: Self-pay | Admitting: Endocrinology

## 2017-08-24 ENCOUNTER — Encounter: Payer: Self-pay | Admitting: Endocrinology

## 2017-08-24 VITALS — BP 140/78 | HR 65 | Ht 64.0 in | Wt 172.2 lb

## 2017-08-24 DIAGNOSIS — E063 Autoimmune thyroiditis: Secondary | ICD-10-CM

## 2017-08-25 MED ORDER — SYNTHROID 75 MCG PO TABS: 37.5000 ug | ORAL_TABLET | Freq: Every day | ORAL | 3 refills | Status: DC

## 2017-09-24 ENCOUNTER — Other Ambulatory Visit: Payer: Self-pay | Admitting: Cardiology

## 2017-10-28 ENCOUNTER — Other Ambulatory Visit: Payer: Self-pay | Admitting: Cardiology

## 2017-11-03 ENCOUNTER — Other Ambulatory Visit: Payer: Self-pay | Admitting: Cardiology

## 2017-11-25 ENCOUNTER — Other Ambulatory Visit: Payer: Self-pay | Admitting: Cardiology

## 2017-11-27 ENCOUNTER — Other Ambulatory Visit: Payer: Self-pay | Admitting: Cardiology

## 2017-11-27 MED ORDER — METOPROLOL SUCCINATE ER 50 MG PO TB24: ORAL_TABLET | ORAL | 0 refills | Status: DC

## 2017-11-27 NOTE — Telephone Encounter (Signed)
°*  STAT* If patient is at the pharmacy, call can be transferred to refill team.   1. Which medications need to be refilled? (please list name of each medication and dose if known) Pt needs enough until her appt on 12-06-17- Metoprolol  2. Which pharmacy/location (including street and city if local pharmacy) is medication to be sent to? [facility] [phone]  3. Do they need a 30 day or 90 day supply? 10 until her appt on Thursday

## 2017-11-27 NOTE — Telephone Encounter (Signed)
Pt's medication was sent to pt's pharmacy as requested. Confirmation received.  °

## 2017-11-29 ENCOUNTER — Other Ambulatory Visit: Payer: Self-pay | Admitting: Endocrinology
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
## 2017-12-06 ENCOUNTER — Encounter (INDEPENDENT_AMBULATORY_CARE_PROVIDER_SITE_OTHER): Payer: Self-pay

## 2017-12-06 ENCOUNTER — Encounter: Payer: Self-pay | Admitting: Cardiology

## 2017-12-06 ENCOUNTER — Ambulatory Visit (INDEPENDENT_AMBULATORY_CARE_PROVIDER_SITE_OTHER): Payer: Self-pay | Admitting: Cardiology

## 2017-12-06 VITALS — BP 122/78 | HR 60 | Ht 64.0 in | Wt 175.6 lb

## 2017-12-06 DIAGNOSIS — R002 Palpitations: Secondary | ICD-10-CM

## 2017-12-06 DIAGNOSIS — Z8249 Family history of ischemic heart disease and other diseases of the circulatory system: Secondary | ICD-10-CM

## 2017-12-06 DIAGNOSIS — I10 Essential (primary) hypertension: Secondary | ICD-10-CM

## 2017-12-06 MED ORDER — METOPROLOL SUCCINATE ER 50 MG PO TB24: ORAL_TABLET | ORAL | 3 refills | Status: DC

## 2017-12-06 MED ORDER — LISINOPRIL 5 MG PO TABS: 5.0000 mg | ORAL_TABLET | Freq: Every day | ORAL | 3 refills | Status: DC

## 2017-12-06 NOTE — Patient Instructions (Signed)

## 2017-12-21 ENCOUNTER — Other Ambulatory Visit: Payer: Self-pay | Admitting: Endocrinology

## 2017-12-24 ENCOUNTER — Other Ambulatory Visit: Payer: Self-pay

## 2017-12-28 ENCOUNTER — Other Ambulatory Visit: Payer: Self-pay

## 2017-12-28 ENCOUNTER — Ambulatory Visit: Payer: Self-pay | Admitting: Endocrinology

## 2017-12-31 ENCOUNTER — Ambulatory Visit: Payer: Self-pay | Admitting: Endocrinology

## 2018-01-16 ENCOUNTER — Telehealth: Payer: Self-pay | Admitting: Endocrinology

## 2018-01-16 NOTE — Telephone Encounter (Signed)
Patient is going out of town-her father passed away. Patient needs print out of lab orders. She needs to pick up today. Please call patient at ph# [phone] when ready-she will be around the area today around 12-1pm

## 2018-01-16 NOTE — Telephone Encounter (Signed)
Called pt and advised her that the lab requisitions have been printed out and are ready for her to pick up anytime at her convenience. Pt verbalized understanding.

## 2018-01-17 ENCOUNTER — Ambulatory Visit: Payer: Self-pay | Admitting: Endocrinology

## 2018-01-27 ENCOUNTER — Other Ambulatory Visit: Payer: Self-pay | Admitting: Endocrinology

## 2018-02-06 ENCOUNTER — Other Ambulatory Visit: Payer: Self-pay | Admitting: Endocrinology

## 2018-02-07 LAB — T3, FREE: T3 FREE: 2.8 pg/mL (ref 2.0–4.4)

## 2018-02-07 LAB — T4, FREE: Free T4: 0.98 ng/dL (ref 0.82–1.77)

## 2018-02-07 LAB — TSH: TSH: 3.71 u[IU]/mL (ref 0.450–4.500)

## 2018-02-11 ENCOUNTER — Encounter: Payer: Self-pay | Admitting: Endocrinology

## 2018-02-11 ENCOUNTER — Ambulatory Visit: Payer: Self-pay | Admitting: Endocrinology

## 2018-02-11 VITALS — BP 118/72 | HR 60 | Ht 64.0 in | Wt 177.4 lb

## 2018-02-11 DIAGNOSIS — E063 Autoimmune thyroiditis: Secondary | ICD-10-CM

## 2018-02-11 MED ORDER — LEVOTHYROXINE SODIUM 25 MCG PO TABS: 25.0000 ug | ORAL_TABLET | Freq: Every day | ORAL | 3 refills | Status: DC

## 2018-02-11 MED ORDER — LIOTHYRONINE SODIUM 5 MCG PO TABS: 5.0000 ug | ORAL_TABLET | Freq: Two times a day (BID) | ORAL | 2 refills | Status: DC
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
## 2018-02-11 NOTE — Patient Instructions (Signed)
Synthroid and T3 2x daily

## 2018-03-01 ENCOUNTER — Other Ambulatory Visit: Payer: Self-pay | Admitting: Endocrinology

## 2018-03-01 ENCOUNTER — Telehealth: Payer: Self-pay | Admitting: Endocrinology

## 2018-03-01 MED ORDER — LIOTHYRONINE SODIUM 5 MCG PO TABS: 5.0000 ug | ORAL_TABLET | Freq: Two times a day (BID) | ORAL | 2 refills | Status: DC

## 2018-03-01 NOTE — Telephone Encounter (Signed)
Refilled Rx liothyronine disp-60, R-2 to [facility]

## 2018-06-21 ENCOUNTER — Other Ambulatory Visit: Payer: Self-pay | Admitting: Endocrinology

## 2018-08-23 ENCOUNTER — Other Ambulatory Visit: Payer: Self-pay | Admitting: Endocrinology

## 2018-11-21 ENCOUNTER — Other Ambulatory Visit: Payer: Self-pay | Admitting: Cardiology

## 2018-12-05 ENCOUNTER — Other Ambulatory Visit: Payer: Self-pay | Admitting: Endocrinology

## 2018-12-17 ENCOUNTER — Ambulatory Visit: Payer: Self-pay | Admitting: Endocrinology

## 2018-12-19 ENCOUNTER — Other Ambulatory Visit: Payer: Self-pay | Admitting: Cardiology

## 2018-12-25 ENCOUNTER — Other Ambulatory Visit: Payer: Self-pay

## 2018-12-25 ENCOUNTER — Ambulatory Visit: Payer: 59 | Admitting: Obstetrics and Gynecology

## 2018-12-25 ENCOUNTER — Encounter: Payer: Self-pay | Admitting: Obstetrics and Gynecology

## 2018-12-25 ENCOUNTER — Other Ambulatory Visit (HOSPITAL_COMMUNITY)
Admission: RE | Admit: 2018-12-25 | Discharge: 2018-12-25 | Disposition: A | Discharge status: Home / Self Care | Payer: 59 | Source: Ambulatory Visit | Attending: Obstetrics and Gynecology | Admitting: Obstetrics and Gynecology

## 2018-12-25 VITALS — BP 118/76 | HR 80 | Temp 97.2°F | Resp 12 | Ht 64.0 in | Wt 179.0 lb

## 2018-12-25 DIAGNOSIS — N852 Hypertrophy of uterus: Secondary | ICD-10-CM | POA: Diagnosis not present

## 2018-12-25 DIAGNOSIS — Z23 Encounter for immunization: Secondary | ICD-10-CM

## 2018-12-25 DIAGNOSIS — Z01419 Encounter for gynecological examination (general) (routine) without abnormal findings: Secondary | ICD-10-CM
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
## 2018-12-25 NOTE — Patient Instructions (Signed)

## 2018-12-26 ENCOUNTER — Other Ambulatory Visit: Payer: Self-pay | Admitting: Cardiology

## 2018-12-27 ENCOUNTER — Other Ambulatory Visit: Payer: Self-pay | Admitting: Family Medicine

## 2018-12-27 DIAGNOSIS — R140 Abdominal distension (gaseous): Secondary | ICD-10-CM

## 2018-12-27 DIAGNOSIS — R102 Pelvic and perineal pain: Secondary | ICD-10-CM

## 2018-12-27 LAB — CYTOLOGY - PAP
Adequacy: ABSENT
Diagnosis: NEGATIVE
HPV: NOT DETECTED

## 2019-01-02 ENCOUNTER — Telehealth: Payer: Self-pay | Admitting: *Deleted

## 2019-01-02 NOTE — Telephone Encounter (Signed)
.      COVID-19 Pre-Screening Questions:  . In the past 7 to 10 days have you had a cough,  shortness of breath, headache, congestion, fever (100 or greater) body aches, chills, sore throat, or sudden loss of taste or sense of smell?NO . Have you been around anyone with known Covid 19?NO . Have you been around anyone who is awaiting Covid 19 test results in the past 7 to 10 days?NO . Have you been around anyone who has been exposed to Covid 19, or has mentioned symptoms of Covid 19 within the past 7 to 10 days?NO  If you have any concerns/questions about symptoms patients report during screening (either on the phone or at threshold). Contact the provider seeing the patient or DOD for further guidance.  If neither are available contact a member of the leadership team.   PATIENT ANSWERED NO TO ALL QUESTIONS AND I HAVE INFORMED HER OF OUR VISITOR POLICY AS WELL AS TO WEAR HER MASK AT ALL TIMES WHILE IN THE BUILDING. SHE VERBALIZED HER UNDERSTANDING AND AGREES WITH PLAN.

## 2019-01-06 ENCOUNTER — Other Ambulatory Visit: Payer: Self-pay

## 2019-01-06 ENCOUNTER — Other Ambulatory Visit: Payer: Self-pay | Admitting: *Deleted

## 2019-01-06 ENCOUNTER — Encounter: Payer: Self-pay | Admitting: Cardiology

## 2019-01-06 ENCOUNTER — Ambulatory Visit: Payer: 59 | Admitting: Cardiology

## 2019-01-06 VITALS — BP 124/72 | HR 63 | Ht 64.0 in | Wt 181.6 lb

## 2019-01-06 DIAGNOSIS — R002 Palpitations: Secondary | ICD-10-CM

## 2019-01-06 DIAGNOSIS — I10 Essential (primary) hypertension: Secondary | ICD-10-CM | POA: Diagnosis not present

## 2019-01-06 MED ORDER — LISINOPRIL 10 MG PO TABS: 5.0000 mg | ORAL_TABLET | Freq: Every day | ORAL | 3 refills | Status: DC
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
## 2019-01-06 NOTE — Patient Instructions (Signed)
Medication Instructions:  The current medical regimen is effective;  continue present plan and medications.  If you need a refill on your cardiac medications before your next appointment, please call your pharmacy.   Follow-Up: At [facility], you and your health needs are our priority.  As part of our continuing mission to provide you with exceptional heart care, we have created designated Provider Care Teams.  These Care Teams include your primary Cardiologist (physician) and Advanced Practice Providers (APPs -  Physician Assistants and Nurse Practitioners) who all work together to provide you with the care you need, when you need it. You will need a follow up appointment in 12 months.  Please call our office 2 months in advance to schedule this appointment.  You may see Mark Skains, MD or one of the following Advanced Practice Providers on your designated Care Team:   Lori Gerhardt, NP Laura Ingold, NP . Jill McDaniel, NP  Thank you for choosing  [facility]!!      

## 2019-01-09 ENCOUNTER — Ambulatory Visit (INDEPENDENT_AMBULATORY_CARE_PROVIDER_SITE_OTHER): Payer: 59

## 2019-01-09 ENCOUNTER — Encounter: Payer: Self-pay | Admitting: Obstetrics and Gynecology

## 2019-01-09 ENCOUNTER — Ambulatory Visit: Payer: 59 | Admitting: Obstetrics and Gynecology

## 2019-01-09 ENCOUNTER — Other Ambulatory Visit: Payer: Self-pay

## 2019-01-09 ENCOUNTER — Other Ambulatory Visit: Payer: 59

## 2019-01-09 VITALS — BP 124/68 | HR 72 | Temp 97.5°F | Resp 12 | Ht 64.0 in | Wt 180.0 lb

## 2019-01-09 DIAGNOSIS — N852 Hypertrophy of uterus: Secondary | ICD-10-CM

## 2019-01-09 DIAGNOSIS — R1900 Intra-abdominal and pelvic swelling, mass and lump, unspecified site: Secondary | ICD-10-CM

## 2019-01-09 NOTE — Progress Notes (Signed)
Encounter reviewed by Dr. Aundria Rud.    ADDENDUM;ENDOMETRIUM APPEARS THICKENED , ECHOGENIC RT OVARY W/ ECHOFREE CYST - 23 X 20 X 18 MM, AVASCULAR, APPEARS SEPARATE FROM PELVIC MASS LT OV W/IRREGULAR BORDERS, SURROUNDED WITH FREE FLUID, CANNOT SEPARATE LEFT OVARY FROM PELVIC MASS - 10 X 7.4 X 9.5 CM, MASS DEMONSTRATES SHADOWING FROM SUPERIOR MOST ASPECT,  3-D DEMONSTRATES APPEARANCE OF CYSTIC W/SOLID BORDERS. THERE ARE SMALL CYSTIC SPACES WITHIN MASS.THE MASS APPEARS AVASCULAR, LT OVARY W/ VASCULAR FLOW. CANNOT R/O LT OVARIAN MASS VERSUS OTHER ETIOLOGY
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
## 2019-01-10 LAB — CEA: CEA: 0.6 ng/mL (ref 0.0–4.7)

## 2019-01-10 LAB — CA 125: Cancer Antigen (CA) 125: 18.5 U/mL (ref 0.0–38.1)

## 2019-01-12 ENCOUNTER — Encounter: Payer: Self-pay | Admitting: Obstetrics and Gynecology

## 2019-01-12 ENCOUNTER — Telehealth: Payer: Self-pay | Admitting: Obstetrics and Gynecology

## 2019-01-12 DIAGNOSIS — R1900 Intra-abdominal and pelvic swelling, mass and lump, unspecified site: Secondary | ICD-10-CM | POA: Insufficient documentation

## 2019-01-12 DIAGNOSIS — N9489 Other specified conditions associated with female genital organs and menstrual cycle: Secondary | ICD-10-CM

## 2019-01-12 NOTE — Telephone Encounter (Signed)
Please make referral to GYN Oncology for left adnexal mass.

## 2019-01-13 NOTE — Telephone Encounter (Signed)
Order placed for referral to [place].   Message to GYN ONC for appt.

## 2019-01-13 NOTE — Telephone Encounter (Signed)
Appointment confirmed with GYN ONC for 01/15/19 at [place] with Dr. Denman George.   Call to patient, advised of appt as seen above. Patient is agreeable to date and time.   Routing to provider for final review. Patient is agreeable to disposition. Will close encounter.  Cc: Magdalene Patricia

## 2019-01-15 ENCOUNTER — Other Ambulatory Visit: Payer: Self-pay | Admitting: Gynecologic Oncology

## 2019-01-15 ENCOUNTER — Encounter: Payer: Self-pay | Admitting: Gynecologic Oncology

## 2019-01-15 ENCOUNTER — Inpatient Hospital Stay: Payer: 59 | Attending: Gynecologic Oncology | Admitting: Gynecologic Oncology

## 2019-01-15 ENCOUNTER — Other Ambulatory Visit: Payer: Self-pay

## 2019-01-15 ENCOUNTER — Inpatient Hospital Stay: Payer: 59

## 2019-01-15 VITALS — BP 144/68 | HR 59 | Temp 98.7°F | Resp 19 | Wt 179.7 lb

## 2019-01-15 DIAGNOSIS — E039 Hypothyroidism, unspecified: Secondary | ICD-10-CM | POA: Insufficient documentation

## 2019-01-15 DIAGNOSIS — E785 Hyperlipidemia, unspecified: Secondary | ICD-10-CM | POA: Insufficient documentation

## 2019-01-15 DIAGNOSIS — Z79899 Other long term (current) drug therapy: Secondary | ICD-10-CM | POA: Diagnosis not present

## 2019-01-15 DIAGNOSIS — N9489 Other specified conditions associated with female genital organs and menstrual cycle: Secondary | ICD-10-CM

## 2019-01-15 DIAGNOSIS — Z6830 Body mass index (BMI) 30.0-30.9, adult: Secondary | ICD-10-CM | POA: Insufficient documentation

## 2019-01-15 DIAGNOSIS — N949 Unspecified condition associated with female genital organs and menstrual cycle: Secondary | ICD-10-CM

## 2019-01-15 DIAGNOSIS — D398 Neoplasm of uncertain behavior of other specified female genital organs: Secondary | ICD-10-CM | POA: Insufficient documentation

## 2019-01-15 DIAGNOSIS — R140 Abdominal distension (gaseous): Secondary | ICD-10-CM | POA: Diagnosis not present

## 2019-01-15 DIAGNOSIS — D3912 Neoplasm of uncertain behavior of left ovary: Secondary | ICD-10-CM | POA: Diagnosis not present

## 2019-01-15 DIAGNOSIS — Z87891 Personal history of nicotine dependence: Secondary | ICD-10-CM | POA: Diagnosis not present

## 2019-01-15 DIAGNOSIS — E663 Overweight: Secondary | ICD-10-CM | POA: Insufficient documentation

## 2019-01-15 DIAGNOSIS — I10 Essential (primary) hypertension: Secondary | ICD-10-CM | POA: Insufficient documentation

## 2019-01-15 LAB — BASIC METABOLIC PANEL
Anion gap: 8 (ref 5–15)
BUN: 11 mg/dL (ref 8–23)
CO2: 29 mmol/L (ref 22–32)
Calcium: 9.7 mg/dL (ref 8.9–10.3)
Chloride: 104 mmol/L (ref 98–111)
Creatinine, Ser: 0.79 mg/dL (ref 0.44–1.00)
GFR calc Af Amer: >60 mL/min (ref >60)
GFR calc non Af Amer: >60 mL/min (ref >60)
Glucose, Bld: 93 mg/dL (ref 70–99)
Potassium: 4.1 mmol/L (ref 3.5–5.1)
Sodium: 141 mmol/L (ref 135–145)

## 2019-01-15 MED ORDER — OXYCODONE HCL 5 MG PO TABS: 5.0000 mg | ORAL_TABLET | ORAL | 0 refills | Status: DC | PRN

## 2019-01-15 MED ORDER — IBUPROFEN 800 MG PO TABS: 800.0000 mg | ORAL_TABLET | Freq: Three times a day (TID) | ORAL | 0 refills | Status: DC | PRN

## 2019-01-15 MED ORDER — TRAMADOL HCL 50 MG PO TABS: 50.0000 mg | ORAL_TABLET | Freq: Four times a day (QID) | ORAL | 0 refills | Status: DC | PRN

## 2019-01-15 MED ORDER — SENNOSIDES-DOCUSATE SODIUM 8.6-50 MG PO TABS: 2.0000 | ORAL_TABLET | Freq: Every day | ORAL | 1 refills | Status: DC

## 2019-01-15 NOTE — Patient Instructions (Addendum)
Preparing for your Surgery  Plan for surgery on February 06, 2019 at [facility] with Dr. Everitt Amber. You will be scheduled for a robotic assisted unilateral salpingo-oophorectomy, possible total laparoscopic hysterectomy, possible staging.   Pre-operative Testing -You will receive a phone call from presurgical testing at [facility] if you have not received a call already to arrange for a pre-operative testing appointment before your surgery.  This appointment normally occurs one to two weeks before your scheduled surgery.   -Bring your insurance card, copy of an advanced directive if applicable, medication list  -At that visit, you will be asked to sign a consent for a possible blood transfusion in case a transfusion becomes necessary during surgery.  The need for a blood transfusion is rare but having consent is a necessary part of your care.     -You should not be taking blood thinners or aspirin at least ten days prior to surgery unless instructed by your surgeon.  -Do not take supplements such as fish oil (omega 3), red yeast rice, tumeric before your surgery. STOP YOUR TURMERIC NOW.  Day Before Surgery at [place] will be asked to take in a light diet the day before surgery.  Avoid carbonated beverages.  You will be advised to have nothing to eat or drink after midnight the evening before.    Eat a light diet the day before surgery.  Examples including soups, broths, toast, yogurt, mashed potatoes.  Things to avoid include carbonated beverages (fizzy beverages), raw fruits and raw vegetables, or beans.   If your bowels are filled with gas, your surgeon will have difficulty visualizing your pelvic organs which increases your surgical risks.  Your role in recovery Your role is to become active as soon as directed by your doctor, while still giving yourself time to heal.  Rest when you feel tired. You will be asked to do the following in order to speed your recovery:  -  Cough and breathe deeply. This helps toclear and expand your lungs and can prevent pneumonia. You may be given a spirometer to practice deep breathing. A staff member will show you how to use the spirometer. - Do mild physical activity. Walking or moving your legs help your circulation and body functions return to normal. A staff member will help you when you try to walk and will provide you with simple exercises. Do not try to get up or walk alone the first time. - Actively manage your pain. Managing your pain lets you move in comfort. We will ask you to rate your pain on a scale of zero to 10. It is your responsibility to tell your doctor or nurse where and how much you hurt so your pain can be treated.  Special Considerations -If you are diabetic, you may be placed on insulin after surgery to have closer control over your blood sugars to promote healing and recovery.  This does not mean that you will be discharged on insulin.  If applicable, your oral antidiabetics will be resumed when you are tolerating a solid diet.  -Your final pathology results from surgery should be available around one week after surgery and the results will be relayed to you when available.  -Dr. Lahoma Crocker is the surgeon that assists your GYN Oncologist with surgery.  If you end up staying the night, the next day after your surgery you will either see Dr. Denman George or Dr. Lahoma Crocker.  -FMLA forms can be faxed to [phone]  and please allow 5-7 business days for completion.  Pain Management After Surgery -You have been prescribed your pain medication (tramadol or ultram) and bowel regimen medications before surgery so that you can have these available when you are discharged from the hospital. The pain medication is for use ONLY AFTER surgery and a new prescription will not be given.   -Make sure that you have Tylenol and Ibuprofen at home to use on a regular basis after surgery for pain control. We recommend  alternating the medications every hour to six hours since they work differently and are processed in the body differently for pain relief.  -Review the attached handout on narcotic use and their risks and side effects.   Bowel Regimen -You have been prescribed Sennakot-S to take nightly to prevent constipation especially if you are taking the narcotic pain medication intermittently.  It is important to prevent constipation and drink adequate amounts of liquids.  Blood Transfusion Information WHAT IS A BLOOD TRANSFUSION? A transfusion is the replacement of blood or some of its parts. Blood is made up of multiple cells which provide different functions.  Red blood cells carry oxygen and are used for blood loss replacement.  White blood cells fight against infection.  Platelets control bleeding.  Plasma helps clot blood.  Other blood products are available for specialized needs, such as hemophilia or other clotting disorders. BEFORE THE TRANSFUSION  Who gives blood for transfusions?   You may be able to donate blood to be used at a later date on yourself (autologous donation).  Relatives can be asked to donate blood. This is generally not any safer than if you have received blood from a stranger. The same precautions are taken to ensure safety when a relative's blood is donated.  Healthy volunteers who are fully evaluated to make sure their blood is safe. This is blood bank blood. Transfusion therapy is the safest it has ever been in the practice of medicine. Before blood is taken from a donor, a complete history is taken to make sure that person has no history of diseases nor engages in risky social behavior (examples are intravenous drug use or sexual activity with multiple partners). The donor's travel history is screened to minimize risk of transmitting infections, such as malaria. The donated blood is tested for signs of infectious diseases, such as HIV and hepatitis. The blood is then  tested to be sure it is compatible with you in order to minimize the chance of a transfusion reaction. If you or a relative donates blood, this is often done in anticipation of surgery and is not appropriate for emergency situations. It takes many days to process the donated blood. RISKS AND COMPLICATIONS Although transfusion therapy is very safe and saves many lives, the main dangers of transfusion include:   Getting an infectious disease.  Developing a transfusion reaction. This is an allergic reaction to something in the blood you were given. Every precaution is taken to prevent this. The decision to have a blood transfusion has been considered carefully by your caregiver before blood is given. Blood is not given unless the benefits outweigh the risks.  AFTER SURGERY INSTRUCTIONS  01/15/2019  Return to work: 4-6 weeks if applicable  Activity: 1. Be up and out of the bed during the day.  Take a nap if needed.  You may walk up steps but be careful and use the hand rail.  Stair climbing will tire you more than you think, you may need to stop  part way and rest.   2. No lifting or straining for 6 weeks.  3. No drivi1ng for 1 week(s).  Do not drive if you are taking narcotic pain medicine.  4. Shower daily.  Use soap and water on your incision and pat dry; don't rub.  No tub baths until cleared by your surgeon.   5. No sexual activity and nothing in the vagina for 8 weeks.  6. You may experience a small amount of clear drainage from your incisions, which is normal.  If the drainage persists or increases, please call the office.  7. You may experience vaginal spotting after surgery or around the 6-8 week mark from surgery when the stitches at the top of the vagina begin to dissolve.  The spotting is normal but if you experience heavy bleeding, call our office (IF YOU HAVE A HYSTERECTOMY).  8. Take Tylenol or ibuprofen first for pain and only use narcotic pain medication for severe pain not  relieved by the Tylenol or Ibuprofen.  Monitor your Tylenol intake to a max of 4,000 mg a day.  Diet: 1. Low sodium Heart Healthy Diet is recommended.  2. It is safe to use a laxative, such as Miralax or Colace, if you have difficulty moving your bowels. You can take Sennakot at bedtime every evening to keep bowel movements regular and to prevent constipation.    Wound Care: 1. Keep clean and dry.  Shower daily.  Reasons to call the Doctor:  Fever - Oral temperature greater than 100.4 degrees Fahrenheit  Foul-smelling vaginal discharge  Difficulty urinating  Nausea and vomiting  Increased pain at the site of the incision that is unrelieved with pain medicine.  Difficulty breathing with or without chest pain  New calf pain especially if only on one side  Sudden, continuing increased vaginal bleeding with or without clots.   Contacts: For questions or concerns you should contact:  Dr. Everitt Amber at [phone]  Joylene John, NP at [phone]  After Hours: call [phone] and have the GYN Oncologist paged/contacted  Tramadol tablets What is this medicine? TRAMADOL (TRA ma dole) is a pain reliever. It is used to treat moderate to severe pain in adults. This medicine may be used for other purposes; ask your health care provider or pharmacist if you have questions. COMMON BRAND NAME(S): Ultram What should I tell my health care provider before I take this medicine? They need to know if you have any of these conditions:  brain tumor  depression  drug abuse or addiction  head injury  if you frequently drink alcohol containing drinks  kidney disease or trouble passing urine  liver disease  lung disease, asthma, or breathing problems  seizures or epilepsy  suicidal thoughts, plans, or attempt; a previous suicide attempt by you or a family member  an unusual or allergic reaction to tramadol, codeine, other medicines, foods, dyes, or preservatives  pregnant  or trying to get pregnant  breast-feeding How should I use this medicine? Take this medicine by mouth with a full glass of water. Follow the directions on the prescription label. You can take it with or without food. If it upsets your stomach, take it with food. Do not take your medicine more often than directed. A special MedGuide will be given to you by the pharmacist with each prescription and refill. Be sure to read this information carefully each time. Talk to your pediatrician regarding the use of this medicine in children. Special care may be needed. Overdosage:  If you think you have taken too much of this medicine contact a poison control center or emergency room at once. NOTE: This medicine is only for you. Do not share this medicine with others. What if I miss a dose? If you miss a dose, take it as soon as you can. If it is almost time for your next dose, take only that dose. Do not take double or extra doses. What may interact with this medicine? Do not take this medication with any of the following medicines:  MAOIs like Carbex, Eldepryl, Marplan, Nardil, and Parnate This medicine may also interact with the following medications:  alcohol  antihistamines for allergy, cough and cold  certain medicines for anxiety or sleep  certain medicines for depression like amitriptyline, fluoxetine, sertraline  certain medicines for migraine headache like almotriptan, eletriptan, frovatriptan, naratriptan, rizatriptan, sumatriptan, zolmitriptan  certain medicines for seizures like carbamazepine, oxcarbazepine, phenobarbital, primidone  certain medicines that treat or prevent blood clots like warfarin  digoxin  furazolidone  general anesthetics like halothane, isoflurane, methoxyflurane, propofol  linezolid  local anesthetics like lidocaine, pramoxine, tetracaine  medicines that relax muscles for surgery  other narcotic medicines for pain or cough  phenothiazines like  chlorpromazine, mesoridazine, prochlorperazine, thioridazine  procarbazine This list may not describe all possible interactions. Give your health care provider a list of all the medicines, herbs, non-prescription drugs, or dietary supplements you use. Also tell them if you smoke, drink alcohol, or use illegal drugs. Some items may interact with your medicine. What should I watch for while using this medicine? Tell your doctor or health care provider if your pain does not go away, if it gets worse, or if you have new or a different type of pain. You may develop tolerance to the medicine. Tolerance means that you will need a higher dose of the medicine for pain relief. Tolerance is normal and is expected if you take this medicine for a long time. This medicine may cause serious skin reactions. They can happen weeks to months after starting the medicine. Contact your health care provider right away if you notice fevers or flu-like symptoms with a rash. The rash may be red or purple and then turn into blisters or peeling of the skin. Or, you might notice a red rash with swelling of the face, lips or lymph nodes in your neck or under your arms. Do not suddenly stop taking your medicine because you may develop a severe reaction. Your body becomes used to the medicine. This does NOT mean you are addicted. Addiction is a behavior related to getting and using a drug for a non-medical reason. If you have pain, you have a medical reason to take pain medicine. Your doctor will tell you how much medicine to take. If your doctor wants you to stop the medicine, the dose will be slowly lowered over time to avoid any side effects. There are different types of narcotic medicines (opiates). If you take more than one type at the same time or if you are taking another medicine that also causes drowsiness, you may have more side effects. Give your health care provider a list of all medicines you use. Your doctor will tell you how  much medicine to take. Do not take more medicine than directed. Call emergency for help if you have problems breathing or unusual sleepiness. You may get drowsy or dizzy. Do not drive, use machinery, or do anything that needs mental alertness until you know how this medicine affects  you. Do not stand or sit up quickly, especially if you are an older patient. This reduces the risk of dizzy or fainting spells. Alcohol can increase or decrease the effects of this medicine. Avoid alcoholic drinks. You may have constipation. Try to have a bowel movement at least every 2 to 3 days. If you do not have a bowel movement for 3 days, call your doctor or health care provider. Your mouth may get dry. Chewing sugarless gum or sucking hard candy, and drinking plenty of water may help. Contact your doctor if the problem does not go away or is severe. What side effects may I notice from receiving this medicine? Side effects that you should report to your doctor or health care professional as soon as possible:  allergic reactions like skin rash, itching or hives, swelling of the face, lips, or tongue  breathing problems  confusion  redness, blistering, peeling or loosening of the skin, including inside the mouth  seizures  signs and symptoms of low blood pressure like dizziness; feeling faint or lightheaded, falls; unusually weak or tired  trouble passing urine or change in the amount of urine Side effects that usually do not require medical attention (report to your doctor or health care professional if they continue or are bothersome):  constipation  dry mouth  nausea, vomiting  tiredness This list may not describe all possible side effects. Call your doctor for medical advice about side effects. You may report side effects to FDA at 1-800-FDA-1088. Where should I keep my medicine? Keep out of the reach of children. This medicine may cause accidental overdose and death if it taken by other adults,  children, or pets. Mix any unused medicine with a substance like cat litter or coffee grounds. Then throw the medicine away in a sealed container like a sealed bag or a coffee can with a lid. Do not use the medicine after the expiration date. Store at room temperature between 15 and 30 degrees C (59 and 86 degrees F). NOTE: This sheet is a summary. It may not cover all possible information. If you have questions about this medicine, talk to your doctor, pharmacist, or health care provider.  2020 Elsevier/Gold Standard (2018-09-13 15:56:48)

## 2019-01-18 ENCOUNTER — Other Ambulatory Visit: Payer: Self-pay | Admitting: Cardiology

## 2019-01-20 ENCOUNTER — Encounter: Payer: Self-pay | Admitting: Gynecologic Oncology

## 2019-01-20 ENCOUNTER — Ambulatory Visit (HOSPITAL_COMMUNITY): Admission: RE | Admit: 2019-01-20 | Payer: 59 | Source: Ambulatory Visit
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
## 2019-01-22 ENCOUNTER — Telehealth: Payer: Self-pay

## 2019-01-22 DIAGNOSIS — N949 Unspecified condition associated with female genital organs and menstrual cycle: Secondary | ICD-10-CM

## 2019-01-22 DIAGNOSIS — R1900 Intra-abdominal and pelvic swelling, mass and lump, unspecified site: Secondary | ICD-10-CM

## 2019-01-23 NOTE — Telephone Encounter (Signed)
Gave Ms Michaelson the appointment for MRI on 01-27-19 at 7 am. Arrival at [place] to [facility].  MRI on first floor to the right of entrance. NPO 4 hours prio to scan = 0300 on 01-27-19. Pt verbalized understanding.

## 2019-01-24 ENCOUNTER — Ambulatory Visit (HOSPITAL_COMMUNITY): Payer: 59

## 2019-01-27 ENCOUNTER — Ambulatory Visit (HOSPITAL_COMMUNITY): Payer: 59

## 2019-01-29 ENCOUNTER — Ambulatory Visit (HOSPITAL_COMMUNITY)
Admission: RE | Admit: 2019-01-29 | Discharge: 2019-01-29 | Disposition: A | Discharge status: Home / Self Care | Payer: 59 | Source: Ambulatory Visit | Attending: Gynecologic Oncology | Admitting: Gynecologic Oncology

## 2019-01-29 ENCOUNTER — Other Ambulatory Visit: Payer: Self-pay

## 2019-01-29 DIAGNOSIS — N949 Unspecified condition associated with female genital organs and menstrual cycle: Secondary | ICD-10-CM

## 2019-01-29 DIAGNOSIS — R1900 Intra-abdominal and pelvic swelling, mass and lump, unspecified site: Secondary | ICD-10-CM

## 2019-01-29 MED ORDER — GADOBUTROL 1 MMOL/ML IV SOLN
8.0000 mL | Freq: Once | INTRAVENOUS | Status: AC | PRN
  Administered 2019-01-29: 8 mL via INTRAVENOUS

## 2019-01-31 ENCOUNTER — Telehealth: Payer: Self-pay

## 2019-01-31 ENCOUNTER — Encounter: Payer: Self-pay | Admitting: Gynecologic Oncology

## 2019-01-31 NOTE — Progress Notes (Signed)
LOV DR [facility] 01-06-19 Epic EKG 01-06-19 EPIC

## 2019-01-31 NOTE — Patient Instructions (Addendum)
YOU NEED TO HAVE A COVID 19 TEST ON 02-03-2019 AT 205 PM, THIS TEST MUST BE DONE BEFORE SURGERY, COME  [place], [place]  , [address]. ONCE YOUR COVID TEST IS COMPLETED, PLEASE BEGIN THE QUARANTINE INSTRUCTIONS AS OUTLINED IN YOUR HANDOUT.                Madeline Garza    Your procedure is scheduled on: 02-06-2019   Report to [facility] Main  Entrance    Report to [place] at 530   AM    1 VISITOR IS ALLOWED TO WAIT IN WAITING ROOM  ONLY DAY OF YOUR SURGERY. NO VISITORS ARE ALLOWED IN SHORT STAY OR RECOVERY ROOM.   Call this number if you have problems the morning of surgery [phone]    Remember: Do not eat food  :After Midnight.    CLEAR LIQUIDS FROM MIDNIGHT UNTIL 430 AM.    BRUSH YOUR TEETH MORNING OF SURGERY AND RINSE YOUR MOUTH OUT, NO CHEWING GUM CANDY OR MINTS.   CLEAR LIQUID DIET  Foods Allowed                                                                     Foods Excluded  Water, Black Coffee and tea, regular and decaf                             liquids that you cannot  Plain Jell-O any favor except red or purple                                           see through such as: Fruit ices (not with fruit pulp)                                     milk, soups, orange juice  Iced Popsicles                                    All solid food                              Cranberry, grape and apple juices Sports drinks like Gatorade Lightly seasoned clear broth or consume(fat free) Sugar, honey syrup  Sample Menu Breakfast                                Lunch                                     Supper Cranberry juice                    Beef broth  Chicken broth Jell-O                                     Grape juice                           Apple juice Coffee or tea                        Jell-O                                      Popsicle                                                Coffee or tea                         Coffee or tea  _____________________________________________________________________  Eat a light diet the day before surgery ON Wednesday 02-05-2019.Marland Kitchen  Examples including soups, broths, toast, yogurt, mashed potatoes.  Things to avoid include carbonated beverages (fizzy beverages), raw fruits and raw vegetables, or beans.   If your bowels are filled with gas, your surgeon will have difficulty visualizing your pelvic organs which increases your surgical risks.   Take these medicines the morning of surgery with A SIP OF WATER: Thyroid, Synthroid, Metoprolol Alprazolam if needed   Bring Inhaler day of surgery                               You may not have any metal on your body including hair pins and              piercings  Do not wear jewelry, make-up, lotions, powders or perfumes, deodorant             Do not wear nail polish.  Do not shave  48 hours prior to surgery.               Do not bring valuables to the hospital. [place].   Contacts, dentures or bridgework may not be worn into surgery.    Patients discharged the day of surgery will not be allowed to drive home. IF YOU ARE HAVING SURGERY AND GOING HOME THE SAME DAY, YOU MUST HAVE AN ADULT TO DRIVE YOU HOME AND BE WITH YOU FOR 24 HOURS. YOU MAY GO HOME BY TAXI OR UBER OR ORTHERWISE, BUT AN ADULT MUST ACCOMPANY YOU HOME AND STAY WITH YOU FOR 24 HOURS.               Please read over the following fact sheets you were given: _____________________________________________________________________             [facility] - Preparing for Surgery Before surgery, you can play an important role.  Because skin is not sterile, your skin needs to be as free of germs as possible.  You can reduce the number of germs on your skin by washing with CHG (chlorahexidine  gluconate) soap before surgery.  CHG is an antiseptic cleaner which kills germs and bonds with the skin to continue killing germs  even after washing. Please DO NOT use if you have an allergy to CHG or antibacterial soaps.  If your skin becomes reddened/irritated stop using the CHG and inform your nurse when you arrive at Short Stay. Do not shave (including legs and underarms) for at least 48 hours prior to the first CHG shower.  You may shave your face/neck. Please follow these instructions carefully:  1.  Shower with CHG Soap the night before surgery and the  morning of Surgery.  2.  If you choose to wash your hair, wash your hair first as usual with your  normal  shampoo.  3.  After you shampoo, rinse your hair and body thoroughly to remove the  shampoo.                             4.  Use CHG as you would any other liquid soap.  You can apply chg directly  to the skin and wash                       Gently with a scrungie or clean washcloth.  5.  Apply the CHG Soap to your body ONLY FROM THE NECK DOWN.   Do not use on face/ open                           Wound or open sores. Avoid contact with eyes, ears mouth and genitals (private parts).                       Wash face,  Genitals (private parts) with your normal soap.             6.  Wash thoroughly, paying special attention to the area where your surgery  will be performed.  7.  Thoroughly rinse your body with warm water from the neck down.  8.  DO NOT shower/wash with your normal soap after using and rinsing off  the CHG Soap.                9.  Pat yourself dry with a clean towel.            10.  Wear clean pajamas.            11.  Place clean sheets on your bed the night of your first shower and do not  sleep with pets. Day of Surgery : Do not apply any lotions/deodorants the morning of surgery.  Please wear clean clothes to the hospital/surgery center.  FAILURE TO FOLLOW THESE INSTRUCTIONS MAY RESULT IN THE CANCELLATION OF YOUR SURGERY PATIENT SIGNATURE_________________________________  NURSE  SIGNATURE__________________________________  ________________________________________________________________________   Madeline Garza  An incentive spirometer is a tool that can help keep your lungs clear and active. This tool measures how well you are filling your lungs with each breath. Taking long deep breaths may help reverse or decrease the chance of developing breathing (pulmonary) problems (especially infection) following:  A long period of time when you are unable to move or be active. BEFORE THE PROCEDURE   If the spirometer includes an indicator to show your best effort, your nurse or respiratory therapist will set it to a desired goal.  If possible, sit up straight or  lean slightly forward. Try not to slouch.  Hold the incentive spirometer in an upright position. INSTRUCTIONS FOR USE  1. Sit on the edge of your bed if possible, or sit up as far as you can in bed or on a chair. 2. Hold the incentive spirometer in an upright position. 3. Breathe out normally. 4. Place the mouthpiece in your mouth and seal your lips tightly around it. 5. Breathe in slowly and as deeply as possible, raising the piston or the ball toward the top of the column. 6. Hold your breath for 3-5 seconds or for as long as possible. Allow the piston or ball to fall to the bottom of the column. 7. Remove the mouthpiece from your mouth and breathe out normally. 8. Rest for a few seconds and repeat Steps 1 through 7 at least 10 times every 1-2 hours when you are awake. Take your time and take a few normal breaths between deep breaths. 9. The spirometer may include an indicator to show your best effort. Use the indicator as a goal to work toward during each repetition. 10. After each set of 10 deep breaths, practice coughing to be sure your lungs are clear. If you have an incision (the cut made at the time of surgery), support your incision when coughing by placing a pillow or rolled up towels firmly  against it. Once you are able to get out of bed, walk around indoors and cough well. You may stop using the incentive spirometer when instructed by your caregiver.  RISKS AND COMPLICATIONS  Take your time so you do not get dizzy or light-headed.  If you are in pain, you may need to take or ask for pain medication before doing incentive spirometry. It is harder to take a deep breath if you are having pain. AFTER USE  Rest and breathe slowly and easily.  It can be helpful to keep track of a log of your progress. Your caregiver can provide you with a simple table to help with this. If you are using the spirometer at home, follow these instructions: [place] IF:   You are having difficultly using the spirometer.  You have trouble using the spirometer as often as instructed.  Your pain medication is not giving enough relief while using the spirometer.  You develop fever of 100.5 F (38.1 C) or higher. SEEK IMMEDIATE MEDICAL CARE IF:   You cough up bloody sputum that had not been present before.  You develop fever of 102 F (38.9 C) or greater.  You develop worsening pain at or near the incision site. MAKE SURE YOU:   Understand these instructions.  Will watch your condition.  Will get help right away if you are not doing well or get worse. Document Released: 10/16/2006 Document Revised: 08/28/2011 Document Reviewed: 12/17/2006 Exit[employer] Patient Information 2014 [employer], [place].   ________________________________________________________________________  WHAT IS A BLOOD TRANSFUSION? Blood Transfusion Information  A transfusion is the replacement of blood or some of its parts. Blood is made up of multiple cells which provide different functions.  Red blood cells carry oxygen and are used for blood loss replacement.  White blood cells fight against infection.  Platelets control bleeding.  Plasma helps clot blood.  Other blood products are available for  specialized needs, such as hemophilia or other clotting disorders. BEFORE THE TRANSFUSION  Who gives blood for transfusions?   Healthy volunteers who are fully evaluated to make sure their blood is safe. This is blood bank blood.  Transfusion therapy is the safest it has ever been in the practice of medicine. Before blood is taken from a donor, a complete history is taken to make sure that person has no history of diseases nor engages in risky social behavior (examples are intravenous drug use or sexual activity with multiple partners). The donor's travel history is screened to minimize risk of transmitting infections, such as malaria. The donated blood is tested for signs of infectious diseases, such as HIV and hepatitis. The blood is then tested to be sure it is compatible with you in order to minimize the chance of a transfusion reaction. If you or a relative donates blood, this is often done in anticipation of surgery and is not appropriate for emergency situations. It takes many days to process the donated blood. RISKS AND COMPLICATIONS Although transfusion therapy is very safe and saves many lives, the main dangers of transfusion include:   Getting an infectious disease.  Developing a transfusion reaction. This is an allergic reaction to something in the blood you were given. Every precaution is taken to prevent this. The decision to have a blood transfusion has been considered carefully by your caregiver before blood is given. Blood is not given unless the benefits outweigh the risks. AFTER THE TRANSFUSION  Right after receiving a blood transfusion, you will usually feel much better and more energetic. This is especially true if your red blood cells have gotten low (anemic). The transfusion raises the level of the red blood cells which carry oxygen, and this usually causes an energy increase.  The nurse administering the transfusion will monitor you carefully for complications. HOME CARE  INSTRUCTIONS  No special instructions are needed after a transfusion. You may find your energy is better. Speak with your caregiver about any limitations on activity for underlying diseases you may have. SEEK MEDICAL CARE IF:   Your condition is not improving after your transfusion.  You develop redness or irritation at the intravenous (IV) site. SEEK IMMEDIATE MEDICAL CARE IF:  Any of the following symptoms occur over the next 12 hours:  Shaking chills.  You have a temperature by mouth above 102 F (38.9 C), not controlled by medicine.  Chest, back, or muscle pain.  People around you feel you are not acting correctly or are confused.  Shortness of breath or difficulty breathing.  Dizziness and fainting.  You get a rash or develop hives.  You have a decrease in urine output.  Your urine turns a dark color or changes to pink, red, or brown. Any of the following symptoms occur over the next 10 days:  You have a temperature by mouth above 102 F (38.9 C), not controlled by medicine.  Shortness of breath.  Weakness after normal activity.  The white part of the eye turns yellow (jaundice).  You have a decrease in the amount of urine or are urinating less often.  Your urine turns a dark color or changes to pink, red, or brown. Document Released: 06/02/2000 Document Revised: 08/28/2011 Document Reviewed: 01/20/2008 [facility] Patient Information 2014 [place], [place].  _______________________________________________________________________

## 2019-01-31 NOTE — Telephone Encounter (Signed)
Spoke with Madeline Garza this pm. Relayed MRI results. Informed pt that surgery on 02/06/2019 will proceed as discussed with Dr. Denman George.

## 2019-02-03 ENCOUNTER — Encounter (HOSPITAL_COMMUNITY)
Admission: RE | Admit: 2019-02-03 | Discharge: 2019-02-03 | Disposition: A | Discharge status: Home / Self Care | Payer: 59 | Source: Ambulatory Visit | Attending: Gynecologic Oncology | Admitting: Gynecologic Oncology

## 2019-02-03 ENCOUNTER — Other Ambulatory Visit (HOSPITAL_COMMUNITY)
Admission: RE | Admit: 2019-02-03 | Discharge: 2019-02-03 | Disposition: A | Discharge status: Home / Self Care | Payer: 59 | Source: Ambulatory Visit | Attending: Gynecologic Oncology | Admitting: Gynecologic Oncology

## 2019-02-03 ENCOUNTER — Encounter (HOSPITAL_COMMUNITY): Payer: Self-pay

## 2019-02-03 ENCOUNTER — Other Ambulatory Visit: Payer: Self-pay

## 2019-02-03 DIAGNOSIS — D271 Benign neoplasm of left ovary: Secondary | ICD-10-CM | POA: Diagnosis present

## 2019-02-03 DIAGNOSIS — Z20828 Contact with and (suspected) exposure to other viral communicable diseases: Secondary | ICD-10-CM | POA: Diagnosis not present

## 2019-02-03 DIAGNOSIS — F419 Anxiety disorder, unspecified: Secondary | ICD-10-CM | POA: Diagnosis not present

## 2019-02-03 DIAGNOSIS — Z79899 Other long term (current) drug therapy: Secondary | ICD-10-CM | POA: Diagnosis not present

## 2019-02-03 DIAGNOSIS — I10 Essential (primary) hypertension: Secondary | ICD-10-CM | POA: Diagnosis not present

## 2019-02-03 DIAGNOSIS — Z6831 Body mass index (BMI) 31.0-31.9, adult: Secondary | ICD-10-CM | POA: Diagnosis not present

## 2019-02-03 DIAGNOSIS — Z87891 Personal history of nicotine dependence: Secondary | ICD-10-CM | POA: Diagnosis not present

## 2019-02-03 DIAGNOSIS — E663 Overweight: Secondary | ICD-10-CM | POA: Diagnosis not present

## 2019-02-03 DIAGNOSIS — E039 Hypothyroidism, unspecified: Secondary | ICD-10-CM | POA: Diagnosis not present

## 2019-02-03 DIAGNOSIS — J45909 Unspecified asthma, uncomplicated: Secondary | ICD-10-CM | POA: Diagnosis not present

## 2019-02-03 DIAGNOSIS — N838 Other noninflammatory disorders of ovary, fallopian tube and broad ligament: Secondary | ICD-10-CM | POA: Diagnosis not present

## 2019-02-03 DIAGNOSIS — Z01812 Encounter for preprocedural laboratory examination: Secondary | ICD-10-CM | POA: Insufficient documentation

## 2019-02-03 DIAGNOSIS — Z79890 Hormone replacement therapy (postmenopausal): Secondary | ICD-10-CM | POA: Diagnosis not present

## 2019-02-03 HISTORY — DX: Unspecified asthma, uncomplicated: J45.909

## 2019-02-03 HISTORY — DX: Anxiety disorder, unspecified: F41.9

## 2019-02-03 HISTORY — DX: Prediabetes: R73.03

## 2019-02-03 HISTORY — DX: Unspecified osteoarthritis, unspecified site: M19.90

## 2019-02-03 HISTORY — DX: Dyspnea, unspecified: R06.00

## 2019-02-03 LAB — COMPREHENSIVE METABOLIC PANEL
ALT: 15 U/L (ref 0–44)
AST: 16 U/L (ref 15–41)
Albumin: 4.2 g/dL (ref 3.5–5.0)
Alkaline Phosphatase: 77 U/L (ref 38–126)
Anion gap: 13 (ref 5–15)
BUN: 10 mg/dL (ref 8–23)
CO2: 25 mmol/L (ref 22–32)
Calcium: 9.8 mg/dL (ref 8.9–10.3)
Chloride: 101 mmol/L (ref 98–111)
Creatinine, Ser: 0.63 mg/dL (ref 0.44–1.00)
GFR calc Af Amer: >60 mL/min (ref >60)
GFR calc non Af Amer: >60 mL/min (ref >60)
Glucose, Bld: 88 mg/dL (ref 70–99)
Potassium: 4.4 mmol/L (ref 3.5–5.1)
Sodium: 139 mmol/L (ref 135–145)
Total Bilirubin: 0.1 mg/dL — ABNORMAL LOW (ref 0.3–1.2)
Total Protein: 7.4 g/dL (ref 6.5–8.1)

## 2019-02-03 LAB — CBC
HCT: 41.1 % (ref 36.0–46.0)
Hemoglobin: 13 g/dL (ref 12.0–15.0)
MCH: 30.2 pg (ref 26.0–34.0)
MCHC: 31.6 g/dL (ref 30.0–36.0)
MCV: 95.4 fL (ref 80.0–100.0)
Platelets: 315 10*3/uL (ref 150–400)
RBC: 4.31 MIL/uL (ref 3.87–5.11)
RDW: 13.2 % (ref 11.5–15.5)
WBC: 5.9 10*3/uL (ref 4.0–10.5)
nRBC: 0 % (ref 0.0–0.2)

## 2019-02-03 LAB — ABO/RH: ABO/RH(D): A POS

## 2019-02-03 LAB — URINALYSIS, ROUTINE W REFLEX MICROSCOPIC
Bilirubin Urine: NEGATIVE
Glucose, UA: NEGATIVE mg/dL
Hgb urine dipstick: NEGATIVE
Ketones, ur: NEGATIVE mg/dL
Leukocytes,Ua: NEGATIVE
Nitrite: NEGATIVE
Protein, ur: NEGATIVE mg/dL
Specific Gravity, Urine: 1.005 (ref 1.005–1.030)
pH: 6 (ref 5.0–8.0)

## 2019-02-03 LAB — HEMOGLOBIN A1C
Hgb A1c MFr Bld: 5.7 % — ABNORMAL HIGH (ref 4.8–5.6)
Mean Plasma Glucose: 116.89 mg/dL

## 2019-02-03 LAB — SARS CORONAVIRUS 2 (TAT 6-24 HRS): SARS Coronavirus 2: NEGATIVE

## 2019-02-03 NOTE — Progress Notes (Signed)
SPOKE W/  Butch Penny     SCREENING SYMPTOMS OF COVID 19:   COUGH--NO  RUNNY NOSE--- NO  SORE THROAT---NO  NASAL CONGESTION----NO  SNEEZING----NO  SHORTNESS OF BREATH---NO  DIFFICULTY BREATHING---NO  TEMP >100.0 -----NO  UNEXPLAINED BODY ACHES------NO  CHILLS -------- NO  HEADACHES ---------NO  LOSS OF SMELL/ TASTE --------NO    HAVE YOU OR ANY FAMILY MEMBER TRAVELLED PAST 14 DAYS OUT OF THE   COUNTY---NO STATE----NO COUNTRY----NO  HAVE YOU OR ANY FAMILY MEMBER BEEN EXPOSED TO ANYONE WITH COVID 19? NO

## 2019-02-04 ENCOUNTER — Encounter: Payer: Self-pay | Admitting: Gynecologic Oncology

## 2019-02-05 NOTE — Anesthesia Preprocedure Evaluation (Addendum)
Anesthesia Evaluation  Patient identified by MRN, date of birth, ID band Patient awake    Reviewed: Allergy & Precautions, NPO status , Patient's Chart, lab work & pertinent test results  History of Anesthesia Complications Negative for: history of anesthetic complications  Airway Mallampati: II  TM Distance: >3 FB Neck ROM: Full    Dental no notable dental hx. (+) Dental Advisory Given   Pulmonary asthma , former smoker,    Pulmonary exam normal        Cardiovascular hypertension, Pt. on home beta blockers negative cardio ROS Normal cardiovascular exam     Neuro/Psych Anxiety negative neurological ROS     GI/Hepatic negative GI ROS, Neg liver ROS,   Endo/Other  Hypothyroidism   Renal/GU negative Renal ROS     Musculoskeletal negative musculoskeletal ROS (+)   Abdominal   Peds  Hematology negative hematology ROS (+)   Anesthesia Other Findings Day of surgery medications reviewed with the patient.  Reproductive/Obstetrics                            Anesthesia Physical Anesthesia Plan  ASA: II  Anesthesia Plan: General   Post-op Pain Management:    Induction: Intravenous  PONV Risk Score and Plan: 3 and Ondansetron, Dexamethasone, Scopolamine patch - Pre-op and Midazolam  Airway Management Planned: Oral ETT  Additional Equipment:   Intra-op Plan:   Post-operative Plan: Extubation in OR  Informed Consent: I have reviewed the patients History and Physical, chart, labs and discussed the procedure including the risks, benefits and alternatives for the proposed anesthesia with the patient or authorized representative who has indicated his/her understanding and acceptance.     Dental advisory given  Plan Discussed with: CRNA and Anesthesiologist  Anesthesia Plan Comments:        Anesthesia Quick Evaluation

## 2019-02-06 ENCOUNTER — Ambulatory Visit (HOSPITAL_COMMUNITY)
Admission: RE | Admit: 2019-02-06 | Discharge: 2019-02-06 | Disposition: A | Discharge status: Home / Self Care | Payer: 59 | Attending: Gynecologic Oncology | Admitting: Gynecologic Oncology

## 2019-02-06 ENCOUNTER — Encounter (HOSPITAL_COMMUNITY): Payer: Self-pay

## 2019-02-06 ENCOUNTER — Other Ambulatory Visit: Payer: Self-pay

## 2019-02-06 ENCOUNTER — Ambulatory Visit (HOSPITAL_COMMUNITY): Payer: 59 | Admitting: Anesthesiology

## 2019-02-06 ENCOUNTER — Ambulatory Visit (HOSPITAL_COMMUNITY): Payer: 59 | Admitting: Physician Assistant

## 2019-02-06 ENCOUNTER — Encounter (HOSPITAL_COMMUNITY)
Admission: RE | Disposition: A | Discharge status: Home / Self Care | Payer: Self-pay | Source: Home / Self Care | Attending: Gynecologic Oncology

## 2019-02-06 DIAGNOSIS — J45909 Unspecified asthma, uncomplicated: Secondary | ICD-10-CM | POA: Insufficient documentation

## 2019-02-06 DIAGNOSIS — E663 Overweight: Secondary | ICD-10-CM | POA: Insufficient documentation

## 2019-02-06 DIAGNOSIS — E039 Hypothyroidism, unspecified: Secondary | ICD-10-CM | POA: Insufficient documentation

## 2019-02-06 DIAGNOSIS — I10 Essential (primary) hypertension: Secondary | ICD-10-CM | POA: Insufficient documentation

## 2019-02-06 DIAGNOSIS — F419 Anxiety disorder, unspecified: Secondary | ICD-10-CM | POA: Insufficient documentation

## 2019-02-06 DIAGNOSIS — N83201 Unspecified ovarian cyst, right side: Secondary | ICD-10-CM | POA: Diagnosis not present

## 2019-02-06 DIAGNOSIS — R1900 Intra-abdominal and pelvic swelling, mass and lump, unspecified site: Secondary | ICD-10-CM

## 2019-02-06 DIAGNOSIS — Z79899 Other long term (current) drug therapy: Secondary | ICD-10-CM | POA: Insufficient documentation

## 2019-02-06 DIAGNOSIS — N838 Other noninflammatory disorders of ovary, fallopian tube and broad ligament: Secondary | ICD-10-CM | POA: Diagnosis not present

## 2019-02-06 DIAGNOSIS — Z79890 Hormone replacement therapy (postmenopausal): Secondary | ICD-10-CM | POA: Insufficient documentation

## 2019-02-06 DIAGNOSIS — Z6831 Body mass index (BMI) 31.0-31.9, adult: Secondary | ICD-10-CM | POA: Insufficient documentation

## 2019-02-06 DIAGNOSIS — Z87891 Personal history of nicotine dependence: Secondary | ICD-10-CM | POA: Insufficient documentation

## 2019-02-06 DIAGNOSIS — N9489 Other specified conditions associated with female genital organs and menstrual cycle: Secondary | ICD-10-CM

## 2019-02-06 DIAGNOSIS — Z20828 Contact with and (suspected) exposure to other viral communicable diseases: Secondary | ICD-10-CM | POA: Insufficient documentation

## 2019-02-06 HISTORY — PX: ROBOTIC ASSISTED BILATERAL SALPINGO OOPHERECTOMY: SHX6078

## 2019-02-06 LAB — TYPE AND SCREEN
ABO/RH(D): A POS
Antibody Screen: NEGATIVE

## 2019-02-06 SURGERY — SALPINGO-OOPHORECTOMY, BILATERAL, ROBOT-ASSISTED
Anesthesia: General

## 2019-02-06 MED ORDER — SUFENTANIL CITRATE 50 MCG/ML IV SOLN
INTRAVENOUS | Status: AC
  Filled 2019-02-06: qty 1

## 2019-02-06 MED ORDER — ARTIFICIAL TEARS OPHTHALMIC OINT
TOPICAL_OINTMENT | OPHTHALMIC | Status: AC
  Filled 2019-02-06: qty 3.5

## 2019-02-06 MED ORDER — CELECOXIB 200 MG PO CAPS
400.0000 mg | ORAL_CAPSULE | ORAL | Status: AC
  Administered 2019-02-06: 06:00:00 400 mg via ORAL
  Filled 2019-02-06: qty 2

## 2019-02-06 MED ORDER — PROPOFOL 10 MG/ML IV BOLUS
INTRAVENOUS | Status: AC
  Filled 2019-02-06: qty 40

## 2019-02-06 MED ORDER — SCOPOLAMINE 1 MG/3DAYS TD PT72
1.0000 | MEDICATED_PATCH | TRANSDERMAL | Status: DC
  Administered 2019-02-06: 06:00:00 1.5 mg via TRANSDERMAL
  Filled 2019-02-06: qty 1

## 2019-02-06 MED ORDER — SUGAMMADEX SODIUM 200 MG/2ML IV SOLN
INTRAVENOUS | Status: DC | PRN
  Administered 2019-02-06: 200 mg via INTRAVENOUS

## 2019-02-06 MED ORDER — ACETAMINOPHEN 500 MG PO TABS: 1000.0000 mg | ORAL_TABLET | Freq: Once | ORAL | Status: DC

## 2019-02-06 MED ORDER — DEXAMETHASONE SODIUM PHOSPHATE 10 MG/ML IJ SOLN
INTRAMUSCULAR | Status: DC | PRN
  Administered 2019-02-06: 8 mg via INTRAVENOUS

## 2019-02-06 MED ORDER — LIDOCAINE 2% (20 MG/ML) 5 ML SYRINGE
INTRAMUSCULAR | Status: DC | PRN
  Administered 2019-02-06: 75 mg via INTRAVENOUS
  Administered 2019-02-06: 25 mg via INTRAVENOUS

## 2019-02-06 MED ORDER — OXYCODONE HCL 5 MG PO TABS
ORAL_TABLET | ORAL | Status: AC
  Filled 2019-02-06: qty 1

## 2019-02-06 MED ORDER — SODIUM CHLORIDE 0.9 % IV SOLN: 250.0000 mL | INTRAVENOUS | Status: DC | PRN

## 2019-02-06 MED ORDER — LACTATED RINGERS IR SOLN
Status: DC | PRN
  Administered 2019-02-06: 1000 mL

## 2019-02-06 MED ORDER — FENTANYL CITRATE (PF) 100 MCG/2ML IJ SOLN
INTRAMUSCULAR | Status: AC
  Administered 2019-02-06: 11:00:00 50 ug via INTRAVENOUS
  Filled 2019-02-06: qty 2

## 2019-02-06 MED ORDER — FENTANYL CITRATE (PF) 100 MCG/2ML IJ SOLN
25.0000 ug | INTRAMUSCULAR | Status: DC | PRN
  Administered 2019-02-06 (×3): 50 ug via INTRAVENOUS

## 2019-02-06 MED ORDER — SUFENTANIL CITRATE 50 MCG/ML IV SOLN
INTRAVENOUS | Status: DC | PRN
  Administered 2019-02-06: 5 ug via INTRAVENOUS
  Administered 2019-02-06: 10 ug via INTRAVENOUS
  Administered 2019-02-06 (×2): 5 ug via INTRAVENOUS
  Administered 2019-02-06: 10 ug via INTRAVENOUS

## 2019-02-06 MED ORDER — OXYCODONE HCL 5 MG PO TABS
5.0000 mg | ORAL_TABLET | ORAL | Status: DC | PRN
  Administered 2019-02-06: 11:00:00 5 mg via ORAL

## 2019-02-06 MED ORDER — PROMETHAZINE HCL 25 MG/ML IJ SOLN
6.2500 mg | INTRAMUSCULAR | Status: DC | PRN
  Administered 2019-02-06: 13:00:00 12.5 mg via INTRAVENOUS

## 2019-02-06 MED ORDER — KETOROLAC TROMETHAMINE 15 MG/ML IJ SOLN: 15.0000 mg | Freq: Four times a day (QID) | INTRAMUSCULAR | Status: DC

## 2019-02-06 MED ORDER — BUPIVACAINE HCL 0.25 % IJ SOLN
INTRAMUSCULAR | Status: DC | PRN
  Administered 2019-02-06: 30 mL

## 2019-02-06 MED ORDER — SUCCINYLCHOLINE CHLORIDE 200 MG/10ML IV SOSY
PREFILLED_SYRINGE | INTRAVENOUS | Status: AC
  Filled 2019-02-06: qty 10

## 2019-02-06 MED ORDER — MIDAZOLAM HCL 2 MG/2ML IJ SOLN
INTRAMUSCULAR | Status: AC
  Filled 2019-02-06: qty 2

## 2019-02-06 MED ORDER — LACTATED RINGERS IV SOLN
INTRAVENOUS | Status: DC
  Administered 2019-02-06 (×2): via INTRAVENOUS

## 2019-02-06 MED ORDER — KETAMINE HCL 10 MG/ML IJ SOLN
INTRAMUSCULAR | Status: AC
  Filled 2019-02-06: qty 1

## 2019-02-06 MED ORDER — BUPIVACAINE LIPOSOME 1.3 % IJ SUSP
20.0000 mL | Freq: Once | INTRAMUSCULAR | Status: AC
  Administered 2019-02-06: 20 mL
  Filled 2019-02-06: qty 20

## 2019-02-06 MED ORDER — ROCURONIUM BROMIDE 10 MG/ML (PF) SYRINGE
PREFILLED_SYRINGE | INTRAVENOUS | Status: AC
  Filled 2019-02-06: qty 10

## 2019-02-06 MED ORDER — SODIUM CHLORIDE 0.9% FLUSH: 3.0000 mL | INTRAVENOUS | Status: DC | PRN

## 2019-02-06 MED ORDER — DEXAMETHASONE SODIUM PHOSPHATE 10 MG/ML IJ SOLN
INTRAMUSCULAR | Status: AC
  Filled 2019-02-06: qty 1

## 2019-02-06 MED ORDER — ACETAMINOPHEN 500 MG PO TABS
1000.0000 mg | ORAL_TABLET | ORAL | Status: AC
  Administered 2019-02-06: 06:00:00 1000 mg via ORAL
  Filled 2019-02-06: qty 2

## 2019-02-06 MED ORDER — MORPHINE SULFATE (PF) 4 MG/ML IV SOLN: 2.0000 mg | INTRAVENOUS | Status: DC | PRN

## 2019-02-06 MED ORDER — ROCURONIUM BROMIDE 10 MG/ML (PF) SYRINGE
PREFILLED_SYRINGE | INTRAVENOUS | Status: DC | PRN
  Administered 2019-02-06: 55 mg via INTRAVENOUS
  Administered 2019-02-06: 5 mg via INTRAVENOUS

## 2019-02-06 MED ORDER — LIDOCAINE 2% (20 MG/ML) 5 ML SYRINGE
INTRAMUSCULAR | Status: DC | PRN
  Administered 2019-02-06: 1.5 mg/kg/h via INTRAVENOUS

## 2019-02-06 MED ORDER — CELECOXIB 200 MG PO CAPS: 400.0000 mg | ORAL_CAPSULE | Freq: Once | ORAL | Status: DC

## 2019-02-06 MED ORDER — PROMETHAZINE HCL 25 MG/ML IJ SOLN
INTRAMUSCULAR | Status: AC
  Filled 2019-02-06: qty 1

## 2019-02-06 MED ORDER — ACETAMINOPHEN 650 MG RE SUPP
650.0000 mg | RECTAL | Status: DC | PRN
  Filled 2019-02-06: qty 1

## 2019-02-06 MED ORDER — SODIUM CHLORIDE 0.9% FLUSH: 3.0000 mL | Freq: Two times a day (BID) | INTRAVENOUS | Status: DC

## 2019-02-06 MED ORDER — ONDANSETRON HCL 4 MG/2ML IJ SOLN
INTRAMUSCULAR | Status: DC | PRN
  Administered 2019-02-06: 4 mg via INTRAVENOUS

## 2019-02-06 MED ORDER — GABAPENTIN 300 MG PO CAPS
300.0000 mg | ORAL_CAPSULE | ORAL | Status: AC
  Administered 2019-02-06: 06:00:00 300 mg via ORAL
  Filled 2019-02-06: qty 1

## 2019-02-06 MED ORDER — KETAMINE HCL 10 MG/ML IJ SOLN
INTRAMUSCULAR | Status: DC | PRN
  Administered 2019-02-06: 50 mg via INTRAVENOUS

## 2019-02-06 MED ORDER — FENTANYL CITRATE (PF) 100 MCG/2ML IJ SOLN
INTRAMUSCULAR | Status: AC
  Administered 2019-02-06: 50 ug via INTRAVENOUS
  Filled 2019-02-06: qty 2

## 2019-02-06 MED ORDER — MIDAZOLAM HCL 5 MG/5ML IJ SOLN
INTRAMUSCULAR | Status: DC | PRN
  Administered 2019-02-06: 2 mg via INTRAVENOUS

## 2019-02-06 MED ORDER — PROPOFOL 10 MG/ML IV BOLUS
INTRAVENOUS | Status: DC | PRN
  Administered 2019-02-06: 20 mg via INTRAVENOUS
  Administered 2019-02-06: 30 mg via INTRAVENOUS
  Administered 2019-02-06: 150 mg via INTRAVENOUS
  Administered 2019-02-06: 30 mg via INTRAVENOUS

## 2019-02-06 MED ORDER — ACETAMINOPHEN 325 MG PO TABS: 650.0000 mg | ORAL_TABLET | ORAL | Status: DC | PRN

## 2019-02-06 MED ORDER — SCOPOLAMINE 1 MG/3DAYS TD PT72: 1.0000 | MEDICATED_PATCH | TRANSDERMAL | Status: DC

## 2019-02-06 MED ORDER — CEFAZOLIN SODIUM-DEXTROSE 2-4 GM/100ML-% IV SOLN
2.0000 g | INTRAVENOUS | Status: AC
  Administered 2019-02-06: 08:00:00 2 g via INTRAVENOUS
  Filled 2019-02-06: qty 100

## 2019-02-06 MED ORDER — BUPIVACAINE HCL (PF) 0.25 % IJ SOLN
INTRAMUSCULAR | Status: AC
  Filled 2019-02-06: qty 30

## 2019-02-06 MED ORDER — DEXAMETHASONE SODIUM PHOSPHATE 4 MG/ML IJ SOLN: 4.0000 mg | INTRAMUSCULAR | Status: DC

## 2019-02-06 MED ORDER — SUCCINYLCHOLINE CHLORIDE 200 MG/10ML IV SOSY
PREFILLED_SYRINGE | INTRAVENOUS | Status: DC | PRN
  Administered 2019-02-06: 120 mg via INTRAVENOUS

## 2019-02-06 MED ORDER — LIDOCAINE 2% (20 MG/ML) 5 ML SYRINGE
INTRAMUSCULAR | Status: AC
  Filled 2019-02-06: qty 5

## 2019-02-06 MED ORDER — ONDANSETRON HCL 4 MG/2ML IJ SOLN
INTRAMUSCULAR | Status: AC
  Filled 2019-02-06: qty 2

## 2019-02-06 MED ORDER — ENOXAPARIN SODIUM 40 MG/0.4ML ~~LOC~~ SOLN
40.0000 mg | SUBCUTANEOUS | Status: AC
  Administered 2019-02-06: 06:00:00 40 mg via SUBCUTANEOUS
  Filled 2019-02-06: qty 0.4

## 2019-02-06 SURGICAL SUPPLY — 64 items
ADH SKN CLS APL DERMABOND .7 (GAUZE/BANDAGES/DRESSINGS) ×1
AGENT HMST KT MTR STRL THRMB (HEMOSTASIS)
APL ESCP 34 STRL LF DISP (HEMOSTASIS)
APPLICATOR SURGIFLO ENDO (HEMOSTASIS) IMPLANT
BAG LAPAROSCOPIC 12 15 PORT 16 (BASKET) IMPLANT
BAG RETRIEVAL 12/15 (BASKET) ×2
BAG SPEC RTRVL LRG 6X4 10 (ENDOMECHANICALS) ×2
BLADE SURG SZ10 CARB STEEL (BLADE) ×1 IMPLANT
COVER BACK TABLE 60X90IN (DRAPES) ×2 IMPLANT
COVER TIP SHEARS 8 DVNC (MISCELLANEOUS) ×1 IMPLANT
COVER TIP SHEARS 8MM DA VINCI (MISCELLANEOUS) ×1
COVER WAND RF STERILE (DRAPES) IMPLANT
DECANTER SPIKE VIAL GLASS SM (MISCELLANEOUS) ×1 IMPLANT
DERMABOND ADVANCED (GAUZE/BANDAGES/DRESSINGS) ×1
DERMABOND ADVANCED .7 DNX12 (GAUZE/BANDAGES/DRESSINGS) ×1 IMPLANT
DRAPE ARM DVNC X/XI (DISPOSABLE) ×4 IMPLANT
DRAPE COLUMN DVNC XI (DISPOSABLE) ×1 IMPLANT
DRAPE DA VINCI XI ARM (DISPOSABLE) ×4
DRAPE DA VINCI XI COLUMN (DISPOSABLE) ×1
DRAPE SHEET LG 3/4 BI-LAMINATE (DRAPES) ×2 IMPLANT
DRAPE SURG IRRIG POUCH 19X23 (DRAPES) ×2 IMPLANT
DRSG OPSITE POSTOP 4X6 (GAUZE/BANDAGES/DRESSINGS) ×1 IMPLANT
ELECT PENCIL ROCKER SW 15FT (MISCELLANEOUS) ×1 IMPLANT
ELECT REM PT RETURN 15FT ADLT (MISCELLANEOUS) ×2 IMPLANT
GLOVE BIO SURGEON STRL SZ 6 (GLOVE) ×8 IMPLANT
GLOVE BIO SURGEON STRL SZ 6.5 (GLOVE) ×2 IMPLANT
GOWN STRL REUS W/ TWL LRG LVL3 (GOWN DISPOSABLE) ×4 IMPLANT
GOWN STRL REUS W/TWL LRG LVL3 (GOWN DISPOSABLE) ×8
HOLDER FOLEY CATH W/STRAP (MISCELLANEOUS) ×1 IMPLANT
IRRIG SUCT STRYKERFLOW 2 WTIP (MISCELLANEOUS) ×2
IRRIGATION SUCT STRKRFLW 2 WTP (MISCELLANEOUS) ×1 IMPLANT
KIT PROCEDURE DA VINCI SI (MISCELLANEOUS)
KIT PROCEDURE DVNC SI (MISCELLANEOUS) IMPLANT
KIT TURNOVER KIT A (KITS) ×1 IMPLANT
MANIPULATOR UTERINE 4.5 ZUMI (MISCELLANEOUS) ×2 IMPLANT
NDL SPNL 18GX3.5 QUINCKE PK (NEEDLE) IMPLANT
NEEDLE HYPO 22GX1.5 SAFETY (NEEDLE) ×2 IMPLANT
NEEDLE SPNL 18GX3.5 QUINCKE PK (NEEDLE) IMPLANT
OBTURATOR OPTICAL STANDARD 8MM (TROCAR) ×1
OBTURATOR OPTICAL STND 8 DVNC (TROCAR) ×1
OBTURATOR OPTICALSTD 8 DVNC (TROCAR) ×1 IMPLANT
PACK ROBOT GYN CUSTOM WL (TRAY / TRAY PROCEDURE) ×2 IMPLANT
PAD POSITIONING PINK XL (MISCELLANEOUS) ×2 IMPLANT
PORT ACCESS TROCAR AIRSEAL 12 (TROCAR) ×1 IMPLANT
PORT ACCESS TROCAR AIRSEAL 5M (TROCAR) ×1
POUCH SPECIMEN RETRIEVAL 10MM (ENDOMECHANICALS) ×2 IMPLANT
SEAL CANN UNIV 5-8 DVNC XI (MISCELLANEOUS) ×3 IMPLANT
SEAL XI 5MM-8MM UNIVERSAL (MISCELLANEOUS) ×3
SET TRI-LUMEN FLTR TB AIRSEAL (TUBING) ×2 IMPLANT
SPONGE LAP 18X18 RF (DISPOSABLE) ×1 IMPLANT
SURGIFLO W/THROMBIN 8M KIT (HEMOSTASIS) IMPLANT
SUT VIC AB 0 CT1 27 (SUTURE) ×4
SUT VIC AB 0 CT1 27XBRD ANTBC (SUTURE) IMPLANT
SUT VIC AB 3-0 CT1 36 (SUTURE) ×1 IMPLANT
SUT VIC AB 3-0 SH 27 (SUTURE) ×2
SUT VIC AB 3-0 SH 27XBRD (SUTURE) IMPLANT
SUT VIC AB 4-0 PS2 18 (SUTURE) ×4 IMPLANT
SYR 10ML LL (SYRINGE) IMPLANT
TOWEL OR NON WOVEN STRL DISP B (DISPOSABLE) ×2 IMPLANT
TRAP SPECIMEN MUCOUS 40CC (MISCELLANEOUS) ×1 IMPLANT
TRAY FOLEY MTR SLVR 16FR STAT (SET/KITS/TRAYS/PACK) ×2 IMPLANT
UNDERPAD 30X30 (UNDERPADS AND DIAPERS) ×2 IMPLANT
WATER STERILE IRR 1000ML POUR (IV SOLUTION) ×2 IMPLANT
YANKAUER SUCT BULB TIP NO VENT (SUCTIONS) ×1 IMPLANT

## 2019-02-06 NOTE — Op Note (Signed)
OPERATIVE NOTE  Date: 02/06/19  Preoperative Diagnosis: left adnexal mass   Postoperative Diagnosis:  Left ovarian fibroma  Procedure(s) Performed: Robotic-assisted laparoscopic left salpingo-oophorectomy, right salpingectomy, minilaparotomy  Surgeon: Everitt Amber, M.D.  Assistant Surgeon: Lahoma Crocker M.D. (an MD assistant was necessary for tissue manipulation, management of robotic instrumentation, retraction and positioning due to the complexity of the case and hospital policies).   Anesthesia: Gen. endotracheal.  Specimens: left tube and ovaries, right fallopian tube, pelvic washings  Estimated Blood Loss: <20 mL. Blood Replacement: None  Complications: none  Indication for Procedure: left ovarian solid 10cm mass.   Operative Findings: cervical stenosis, 2cm right fallopian tube cyst, 10cm solid left ovarian mass.   [place] pathology was consistent with benign fibroma  Procedure: The patient's taken to the operating room and placed under general endotracheal anesthesia testing difficulty. She is placed in a dorsolithotomy position and cervical acromial pad was placed. The arms were tucked with care taken to pad the olecranon process. And prepped and draped in usual sterile fashion. A uterine manipulator (zumi) was placed vaginally. His was challenging due to cervical stenosis and there was intraperitoneal placement of the manipulator through the posterior wall of the uterus. A 48mm incision was made in the left upper quadrant palmer's point and a 5 mm Optiview trocar used to enter the abdomen under direct visualization. With entry into the abdomen and then maintenance of 15 mm of mercury the patient was placed in Trendelenburg position. An incision was made in the umbilicus and a 92EQ trochar was placed through this site. Two incisions were made lateral to the umbilical incision in the left and right abdomen measuring 32mm. These incisions were made approximately 10 cm lateral to the  umbilical incision. 8 mm robotic trochars were inserted. The robot was docked.  The abdomen was inspected as was the pelvis.  Pelvic washings were obtained. An incision was made on the left pelvic side wall peritoneum parallel to the IP ligament and the retroperitoneal space entered. The left ureter was identified and the para-rectal space was developed. A window was created in the left broad ligament above the ureter. The left infundibulopelvic vessels were skeletonized cauterized and transected. The utero-ovarian ligaments similarly were cauterized and transected. Specimen was placed in an Endo Catch bag.  On the right the fallopian tube was separated from its attachment to the ovary and placed in an endocatch bag. The pelvis was irrigated. Bipolar device was used to create hemostasis at the site of the uterine manipulator exit from the uterus.   The robot was undocked.   A 6cm suprapubic low transverse incision was made with the scalpel. The subcutaneous skin was opened with the bovie. The fascia was opened with the bovie transversely and the rectus muscles were dissected off of the fascia inferiorally and superiorally. The peritoneum was opened sharply in the midline. The peritoneal incision was extended. The ovary and fallopian tube specimens in the endocatch bags were retrieved through the incision. The fascia was closed with running 0-vicryl. The subcutaneous fat was closed with 2-0 vicryl. 20cc of exparel mixed with 20cc of marcaine was infiltrated into the incision. The incision was closed at the skin with monocryl and dermabond.   The ports were all remove. The fascial closure at the umbilical incision and left upper quadrant port was made with 0 Vicryl.  All incisions were closed with a running subcuticular Monocryl suture. Dermabond was applied. Sponge, lap and needle counts were correct x 3.    The  patient had sequential compression devices for VTE prophylaxis.         Disposition: PACU           Condition: stable  Donaciano Eva, MD

## 2019-02-06 NOTE — Anesthesia Postprocedure Evaluation (Signed)
Anesthesia Post Note  Patient: Madeline Garza  Procedure(s) Performed: XI ROBOTIC ASSISTED RIGHT SALPINGECTOMY, LEFT SALPINGO OOPHERECTOMY, MINI LAPARATOMY (N/A )     Patient location during evaluation: PACU Anesthesia Type: General Level of consciousness: sedated Pain management: pain level controlled Vital Signs Assessment: post-procedure vital signs reviewed and stable Respiratory status: spontaneous breathing and respiratory function stable Cardiovascular status: stable Postop Assessment: no apparent nausea or vomiting Anesthetic complications: no    Last Vitals:  Vitals:   02/06/19 1045 02/06/19 1100  BP: (!) 114/57 (!) 99/50  Pulse: (!) 57 (!) 52  Resp: 12 13  Temp:    SpO2: 100% 98%    Last Pain:  Vitals:   02/06/19 1015  TempSrc:   PainSc: 5                  Jolissa Kapral DANIEL

## 2019-02-06 NOTE — Anesthesia Procedure Notes (Signed)
Procedure Name: Intubation Date/Time: 02/06/2019 7:34 AM Performed by: Lissa Morales, CRNA Pre-anesthesia Checklist: Patient identified, Emergency Drugs available, Suction available and Patient being monitored Patient Re-evaluated:Patient Re-evaluated prior to induction Oxygen Delivery Method: Circle system utilized Preoxygenation: Pre-oxygenation with 100% oxygen Induction Type: IV induction Laryngoscope Size: Mac and 4 Grade View: Grade I Tube type: Oral Tube size: 7.5 mm Number of attempts: 1 Airway Equipment and Method: Stylet and Oral airway Placement Confirmation: ETT inserted through vocal cords under direct vision,  positive ETCO2 and breath sounds checked- equal and bilateral Secured at: 21 cm Tube secured with: Tape Dental Injury: Teeth and Oropharynx as per pre-operative assessment

## 2019-02-06 NOTE — Discharge Instructions (Signed)
02/06/2019   Dr Denman George removed your left fallopian tube and ovary. It contained a 10cm solid benign (noncancerous mass) called a fibroma. The right fallopian tube was also removed as it contained a benign cyst.  Return to work: 4 weeks  Activity: 1. Be up and out of the bed during the day.  Take a nap if needed.  You may walk up steps but be careful and use the hand rail.  Stair climbing will tire you more than you think, you may need to stop part way and rest.   2. No lifting or straining for 6 weeks.  3. No driving for 1 weeks.  Do Not drive if you are taking narcotic pain medicine.  4. Shower daily.  Use soap and water on your incision and pat dry; don't rub.   5. No sexual activity and nothing in the vagina for 2 weeks.  Medications:  - Take ibuprofen and tylenol first line for pain control. Take these regularly (every 6 hours) to decrease the build up of pain.  - If necessary, for severe pain not relieved by ibuprofen, take percocet.  - While taking percocet you should take sennakot every night to reduce the likelihood of constipation. If this causes diarrhea, stop its use.  Diet: 1. Low sodium Heart Healthy Diet is recommended.  2. It is safe to use a laxative if you have difficulty moving your bowels.   Wound Care: 1. Keep clean and dry.  Shower daily. 2. You can get the dressing wet in the shower, but avoid tub baths. 3. Remove the dressing between 5 and 7 days after your surgery (1 week after your operation). 4. After the dressing is removed you can get the incision wet in the shower, however continue to avoid tub baths until advised otherwise by your surgeon at follow-up.   Reasons to call the Doctor:   Fever - Oral temperature greater than 100.4 degrees Fahrenheit  Foul-smelling vaginal discharge  Difficulty urinating  Nausea and vomiting  Increased pain at the site of the incision that is unrelieved with pain medicine.  Difficulty breathing with or without  chest pain  New calf pain especially if only on one side  Sudden, continuing increased vaginal bleeding with or without clots.   Follow-up: 1. See Everitt Amber in 4 weeks.  Contacts: For questions or concerns you should contact:  Dr. Everitt Amber at [phone] After hours and on week-ends call [phone] and ask to speak to the physician on call for Gynecologic Oncology

## 2019-02-06 NOTE — Interval H&P Note (Signed)
History and Physical Interval Note:  02/06/2019 7:17 AM  Madeline Garza  has presented today for surgery, with the diagnosis of LEFT OVARIAN MASS.  The various methods of treatment have been discussed with the patient and family. After consideration of risks, benefits and other options for treatment, the patient has consented to  Procedure(s): XI ROBOTIC [place], (N/A) POSSIBLE XI ROBOTIC ASSISTED TOTAL HYSTERECTOMY (N/A) POSSIBLE STAGING (N/A) as a surgical intervention.  The patient's history has been reviewed, patient examined, no change in status, stable for surgery.  I have reviewed the patient's chart and labs.  Questions were answered to the patient's satisfaction.     Thereasa Solo

## 2019-02-06 NOTE — Transfer of Care (Signed)
Immediate Anesthesia Transfer of Care Note  Patient: Madeline Garza  Procedure(s) Performed: XI ROBOTIC ASSISTED RIGHT SALPINGECTOMY, LEFT SALPINGO OOPHERECTOMY, MINI LAPARATOMY (N/A )  Patient Location: PACU  Anesthesia Type:General  Level of Consciousness: awake, alert , oriented and patient cooperative  Airway & Oxygen Therapy: Patient Spontanous Breathing and Patient connected to face mask oxygen  Post-op Assessment: Report given to RN, Post -op Vital signs reviewed and stable and Patient moving all extremities X 4  Post vital signs: stable  Last Vitals:  Vitals Value Taken Time  BP 116/64 02/06/19 0933  Temp    Pulse 59 02/06/19 0939  Resp 8 02/06/19 0939  SpO2 100 % 02/06/19 0939  Vitals shown include unvalidated device data.  Last Pain:  Vitals:   02/06/19 0558  TempSrc:   PainSc: 0-No pain         Complications: No apparent anesthesia complications

## 2019-02-07 ENCOUNTER — Encounter (HOSPITAL_COMMUNITY): Payer: Self-pay | Admitting: Gynecologic Oncology

## 2019-02-07 ENCOUNTER — Telehealth: Payer: Self-pay

## 2019-02-07 NOTE — Telephone Encounter (Signed)
I spoke with Madeline Garza this am. She reports being "a little sore". She is taking pain medication with relief. Pt denied n/v. Reviewed use of senokot. Pt verbalized understanding. Encouraged patient to call with any questions or issues.

## 2019-02-10 ENCOUNTER — Telehealth: Payer: Self-pay

## 2019-02-10 NOTE — Telephone Encounter (Signed)
I spoke with Madeline Garza this am.  I told her that the pathology results from her surgery were benign. She verbalized understanding.

## 2019-02-11 ENCOUNTER — Encounter: Payer: Self-pay | Admitting: Gynecologic Oncology

## 2019-02-11 ENCOUNTER — Telehealth: Payer: Self-pay

## 2019-02-11 NOTE — Telephone Encounter (Signed)
I spoke with Ms Madeline Garza.  Per Madeline John, NP. Pt can take oral benadryl. Instructed her not to drive if she takes the oral benadryl.  She can also apply hydrocortisone cream or benadryl cream on the rash but to avoid the incision itself.  I told the patient to contact us if the rash becomes worse or it begins to itch. Ms Madeline Garza verbalized understanding.

## 2019-02-13 ENCOUNTER — Encounter: Payer: Self-pay | Admitting: Gynecologic Oncology

## 2019-02-13 ENCOUNTER — Telehealth: Payer: Self-pay | Admitting: Gynecologic Oncology

## 2019-02-13 DIAGNOSIS — L233 Allergic contact dermatitis due to drugs in contact with skin: Secondary | ICD-10-CM

## 2019-02-13 MED ORDER — HYDROXYZINE HCL 10 MG PO TABS: 10.0000 mg | ORAL_TABLET | Freq: Three times a day (TID) | ORAL | 1 refills | Status: DC | PRN

## 2019-02-13 MED ORDER — PREDNISONE 5 MG PO TABS: ORAL_TABLET | ORAL | 0 refills | Status: DC

## 2019-02-13 NOTE — Telephone Encounter (Signed)
Called patient to discuss incisional erythema and itching.  She states all her incisions look like the pictures she sent in her mychart message.  She states they are itching and she is not sure if she is allergic to the dermabond.  She has not removed the op site dressing from her mini lap incision.  Advised to remove that today and to call or send a picture if it is red also or looks concerning.  Discussed that we would be sending in a prednisone dose pack along with atarax for itching.  Reportable signs and symptoms reviewed.  Patient states she has taken prednisone in the past for poison ivy and has tolerated well.  She will call the office for any concerns or changes in incisions.

## 2019-02-20 ENCOUNTER — Encounter: Payer: Self-pay | Admitting: Gynecologic Oncology

## 2019-03-03 ENCOUNTER — Other Ambulatory Visit: Payer: Self-pay

## 2019-03-03 ENCOUNTER — Encounter: Payer: Self-pay | Admitting: Gynecologic Oncology

## 2019-03-03 ENCOUNTER — Inpatient Hospital Stay: Payer: 59 | Attending: Gynecologic Oncology | Admitting: Gynecologic Oncology

## 2019-03-03 VITALS — BP 120/70 | HR 75 | Temp 98.7°F | Resp 18 | Ht 64.0 in | Wt 180.0 lb

## 2019-03-03 DIAGNOSIS — Z90722 Acquired absence of ovaries, bilateral: Secondary | ICD-10-CM

## 2019-03-03 DIAGNOSIS — J45909 Unspecified asthma, uncomplicated: Secondary | ICD-10-CM | POA: Diagnosis not present

## 2019-03-03 DIAGNOSIS — E039 Hypothyroidism, unspecified: Secondary | ICD-10-CM | POA: Diagnosis not present

## 2019-03-03 DIAGNOSIS — M1990 Unspecified osteoarthritis, unspecified site: Secondary | ICD-10-CM | POA: Insufficient documentation

## 2019-03-03 DIAGNOSIS — F419 Anxiety disorder, unspecified: Secondary | ICD-10-CM | POA: Diagnosis not present

## 2019-03-03 DIAGNOSIS — E785 Hyperlipidemia, unspecified: Secondary | ICD-10-CM | POA: Diagnosis not present

## 2019-03-03 DIAGNOSIS — I10 Essential (primary) hypertension: Secondary | ICD-10-CM | POA: Diagnosis not present

## 2019-03-03 DIAGNOSIS — D270 Benign neoplasm of right ovary: Secondary | ICD-10-CM | POA: Diagnosis not present

## 2019-03-03 DIAGNOSIS — N9489 Other specified conditions associated with female genital organs and menstrual cycle: Secondary | ICD-10-CM

## 2019-03-03 DIAGNOSIS — Z79899 Other long term (current) drug therapy: Secondary | ICD-10-CM | POA: Insufficient documentation

## 2019-03-03 DIAGNOSIS — Z87891 Personal history of nicotine dependence: Secondary | ICD-10-CM | POA: Insufficient documentation

## 2019-03-03 DIAGNOSIS — Z780 Asymptomatic menopausal state: Secondary | ICD-10-CM | POA: Diagnosis not present

## 2019-03-03 NOTE — Patient Instructions (Signed)
Dr Denman George applied dermabond skin glue to your incisions and adhesive tape to the abdomen. You had skin reactions to these components.  It is safe to resume all normal activities.
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
## 2019-07-14 ENCOUNTER — Encounter: Payer: Self-pay | Admitting: Obstetrics and Gynecology

## 2019-07-22 ENCOUNTER — Telehealth: Payer: Self-pay | Admitting: Obstetrics and Gynecology

## 2019-07-22 ENCOUNTER — Encounter: Payer: Self-pay | Admitting: Obstetrics and Gynecology

## 2019-07-22 NOTE — Telephone Encounter (Signed)
Call placed to patient, no answer, mailbox full.   MyChart message to patient requesting return call.

## 2019-07-22 NOTE — Telephone Encounter (Signed)
Patient returned call

## 2019-07-22 NOTE — Telephone Encounter (Signed)
Spoke with patient, advised of results as seen below per Dr. Quincy Simmonds. Patient verbalizes understanding and is agreeable.   Encounter closed.

## 2019-07-22 NOTE — Telephone Encounter (Signed)
Please contact patient with bone density results from [facility] (07/14/19) which showed mild osteopenia of her hips.  This is early bone thinning but not osteoporosis.  The risk of fracture is not considered high enough to proceed with pharmacologic treatment.   1200 mg of calcium daily, 800 IU of vitamin D daily, and weight bearing exercise will help to maintain bone density and strength.  Her next bone density is due in 2 years.

## 2019-11-12 DIAGNOSIS — E039 Hypothyroidism, unspecified: Secondary | ICD-10-CM | POA: Diagnosis not present

## 2019-11-12 DIAGNOSIS — E559 Vitamin D deficiency, unspecified: Secondary | ICD-10-CM | POA: Diagnosis not present

## 2019-11-12 DIAGNOSIS — R5383 Other fatigue: Secondary | ICD-10-CM | POA: Diagnosis not present

## 2019-11-12 DIAGNOSIS — N951 Menopausal and female climacteric states: Secondary | ICD-10-CM | POA: Diagnosis not present

## 2019-11-12 DIAGNOSIS — E119 Type 2 diabetes mellitus without complications: Secondary | ICD-10-CM | POA: Diagnosis not present

## 2019-11-26 ENCOUNTER — Other Ambulatory Visit: Payer: Self-pay | Admitting: Cardiology

## 2019-11-26 MED ORDER — LISINOPRIL 10 MG PO TABS: 5.0000 mg | ORAL_TABLET | Freq: Every day | ORAL | 3 refills | Status: DC

## 2019-11-26 MED ORDER — METOPROLOL SUCCINATE ER 50 MG PO TB24: ORAL_TABLET | ORAL | 0 refills | Status: DC

## 2019-11-26 NOTE — Telephone Encounter (Signed)
Pt's medications were sent to pt's pharmacy as requested. Confirmation received.  

## 2019-12-09 ENCOUNTER — Other Ambulatory Visit: Payer: Self-pay | Admitting: Cardiology

## 2019-12-11 DIAGNOSIS — L820 Inflamed seborrheic keratosis: Secondary | ICD-10-CM | POA: Diagnosis not present

## 2019-12-11 DIAGNOSIS — L570 Actinic keratosis: Secondary | ICD-10-CM | POA: Diagnosis not present

## 2019-12-11 DIAGNOSIS — D1801 Hemangioma of skin and subcutaneous tissue: Secondary | ICD-10-CM | POA: Diagnosis not present

## 2019-12-11 DIAGNOSIS — D229 Melanocytic nevi, unspecified: Secondary | ICD-10-CM | POA: Diagnosis not present

## 2019-12-11 DIAGNOSIS — L821 Other seborrheic keratosis: Secondary | ICD-10-CM | POA: Diagnosis not present

## 2019-12-30 ENCOUNTER — Encounter: Payer: Self-pay | Admitting: Obstetrics and Gynecology

## 2019-12-30 ENCOUNTER — Ambulatory Visit: Payer: BC Managed Care – PPO | Admitting: Obstetrics and Gynecology

## 2019-12-30 ENCOUNTER — Other Ambulatory Visit: Payer: Self-pay

## 2019-12-30 VITALS — BP 142/70 | HR 64 | Resp 16 | Ht 63.5 in | Wt 190.2 lb

## 2019-12-30 DIAGNOSIS — Z01419 Encounter for gynecological examination (general) (routine) without abnormal findings: Secondary | ICD-10-CM | POA: Diagnosis not present

## 2019-12-30 NOTE — Patient Instructions (Signed)
Madeline Garza,   You still have your uterus and your right ovary.  Please ask you integrative doctor if you are taking progesterone to balance your estrogen cream! You need progesterone because you still have your uterus.  Madeline Half, MD   EXERCISE AND DIET:  We recommended that you start or continue a regular exercise program for good health. Regular exercise means any activity that makes your heart beat faster and makes you sweat.  We recommend exercising at least 30 minutes per day at least 3 days a week, preferably 4 or 5.  We also recommend a diet low in fat and sugar.  Inactivity, poor dietary choices and obesity can cause diabetes, heart attack, stroke, and kidney damage, among others.    ALCOHOL AND SMOKING:  Women should limit their alcohol intake to no more than 7 drinks/beers/glasses of wine (combined, not each!) per week. Moderation of alcohol intake to this level decreases your risk of breast cancer and liver damage. And of course, no recreational drugs are part of a healthy lifestyle.  And absolutely no smoking or even second hand smoke. Most people know smoking can cause heart and lung diseases, but did you know it also contributes to weakening of your bones? Aging of your skin?  Yellowing of your teeth and nails?  CALCIUM AND VITAMIN D:  Adequate intake of calcium and Vitamin D are recommended.  The recommendations for exact amounts of these supplements seem to change often, but generally speaking 600 mg of calcium (either carbonate or citrate) and 800 units of Vitamin D per day seems prudent. Certain women may benefit from higher intake of Vitamin D.  If you are among these women, your doctor will have told you during your visit.    PAP SMEARS:  Pap smears, to check for cervical cancer or precancers,  have traditionally been done yearly, although recent scientific advances have shown that most women can have pap smears less often.  However, every woman still should have a physical exam from  her gynecologist every year. It will include a breast check, inspection of the vulva and vagina to check for abnormal growths or skin changes, a visual exam of the cervix, and then an exam to evaluate the size and shape of the uterus and ovaries.  And after 62 years of age, a rectal exam is indicated to check for rectal cancers. We will also provide age appropriate advice regarding health maintenance, like when you should have certain vaccines, screening for sexually transmitted diseases, bone density testing, colonoscopy, mammograms, etc.   MAMMOGRAMS:  All women over 65 years old should have a yearly mammogram. Many facilities now offer a "3D" mammogram, which may cost around $50 extra out of pocket. If possible,  we recommend you accept the option to have the 3D mammogram performed.  It both reduces the number of women who will be called back for extra views which then turn out to be normal, and it is better than the routine mammogram at detecting truly abnormal areas.    COLONOSCOPY:  Colonoscopy to screen for colon cancer is recommended for all women at age 55.  We know, you hate the idea of the prep.  We agree, BUT, having colon cancer and not knowing it is worse!!  Colon cancer so often starts as a polyp that can be seen and removed at colonscopy, which can quite literally save your life!  And if your first colonoscopy is normal and you have no family history of colon cancer,  most women don't have to have it again for 10 years.  Once every ten years, you can do something that may end up saving your life, right?  We will be happy to help you get it scheduled when you are ready.  Be sure to check your insurance coverage so you understand how much it will cost.  It may be covered as a preventative service at no cost, but you should check your particular policy.
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
## 2020-01-15 DIAGNOSIS — E039 Hypothyroidism, unspecified: Secondary | ICD-10-CM | POA: Diagnosis not present

## 2020-01-15 DIAGNOSIS — R5383 Other fatigue: Secondary | ICD-10-CM | POA: Diagnosis not present

## 2020-01-15 DIAGNOSIS — I10 Essential (primary) hypertension: Secondary | ICD-10-CM | POA: Diagnosis not present

## 2020-02-11 ENCOUNTER — Other Ambulatory Visit: Payer: Self-pay

## 2020-02-11 ENCOUNTER — Encounter: Payer: Self-pay | Admitting: Cardiology

## 2020-02-11 ENCOUNTER — Ambulatory Visit: Payer: BC Managed Care – PPO | Admitting: Cardiology

## 2020-02-11 VITALS — BP 112/60 | HR 59 | Ht 63.5 in | Wt 192.0 lb

## 2020-02-11 DIAGNOSIS — I10 Essential (primary) hypertension: Secondary | ICD-10-CM

## 2020-02-11 DIAGNOSIS — R002 Palpitations: Secondary | ICD-10-CM
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
## 2020-02-11 NOTE — Patient Instructions (Signed)
Medication Instructions:  The current medical regimen is effective;  continue present plan and medications.  *If you need a refill on your cardiac medications before your next appointment, please call your pharmacy*  Testing/Procedures: Please let us know if you decide to have the Coronary Calcium Score testing completed.  Follow-Up: At [facility], you and your health needs are our priority.  As part of our continuing mission to provide you with exceptional heart care, we have created designated Provider Care Teams.  These Care Teams include your primary Cardiologist (physician) and Advanced Practice Providers (APPs -  Physician Assistants and Nurse Practitioners) who all work together to provide you with the care you need, when you need it.  We recommend signing up for the patient portal called "MyChart".  Sign up information is provided on this After Visit Summary.  MyChart is used to connect with patients for Virtual Visits (Telemedicine).  Patients are able to view lab/test results, encounter notes, upcoming appointments, etc.  Non-urgent messages can be sent to your provider as well.   To learn more about what you can do with MyChart, go to [email or web address].    Your next appointment:   12 month(s)  The format for your next appointment:   In Person  Provider:   Candee Furbish, MD   Thank you for choosing [facility]!!

## 2020-03-22 ENCOUNTER — Other Ambulatory Visit: Payer: Self-pay | Admitting: Cardiology

## 2020-05-25 DIAGNOSIS — M9902 Segmental and somatic dysfunction of thoracic region: Secondary | ICD-10-CM | POA: Diagnosis not present

## 2020-05-25 DIAGNOSIS — M9903 Segmental and somatic dysfunction of lumbar region: Secondary | ICD-10-CM | POA: Diagnosis not present

## 2020-05-25 DIAGNOSIS — M9904 Segmental and somatic dysfunction of sacral region: Secondary | ICD-10-CM | POA: Diagnosis not present

## 2020-05-25 DIAGNOSIS — M9901 Segmental and somatic dysfunction of cervical region: Secondary | ICD-10-CM | POA: Diagnosis not present

## 2020-05-26 DIAGNOSIS — M9901 Segmental and somatic dysfunction of cervical region: Secondary | ICD-10-CM | POA: Diagnosis not present

## 2020-05-26 DIAGNOSIS — M9904 Segmental and somatic dysfunction of sacral region: Secondary | ICD-10-CM | POA: Diagnosis not present

## 2020-05-26 DIAGNOSIS — M9903 Segmental and somatic dysfunction of lumbar region: Secondary | ICD-10-CM | POA: Diagnosis not present

## 2020-05-26 DIAGNOSIS — M9902 Segmental and somatic dysfunction of thoracic region: Secondary | ICD-10-CM | POA: Diagnosis not present

## 2020-05-27 DIAGNOSIS — M9902 Segmental and somatic dysfunction of thoracic region: Secondary | ICD-10-CM | POA: Diagnosis not present

## 2020-05-27 DIAGNOSIS — M9903 Segmental and somatic dysfunction of lumbar region: Secondary | ICD-10-CM | POA: Diagnosis not present

## 2020-05-27 DIAGNOSIS — M9901 Segmental and somatic dysfunction of cervical region: Secondary | ICD-10-CM | POA: Diagnosis not present

## 2020-05-27 DIAGNOSIS — M9904 Segmental and somatic dysfunction of sacral region: Secondary | ICD-10-CM | POA: Diagnosis not present

## 2020-05-28 DIAGNOSIS — M9903 Segmental and somatic dysfunction of lumbar region: Secondary | ICD-10-CM | POA: Diagnosis not present

## 2020-05-28 DIAGNOSIS — M9902 Segmental and somatic dysfunction of thoracic region: Secondary | ICD-10-CM | POA: Diagnosis not present

## 2020-05-28 DIAGNOSIS — M9904 Segmental and somatic dysfunction of sacral region: Secondary | ICD-10-CM | POA: Diagnosis not present

## 2020-05-28 DIAGNOSIS — M9901 Segmental and somatic dysfunction of cervical region: Secondary | ICD-10-CM | POA: Diagnosis not present

## 2020-06-01 DIAGNOSIS — M9904 Segmental and somatic dysfunction of sacral region: Secondary | ICD-10-CM | POA: Diagnosis not present

## 2020-06-01 DIAGNOSIS — M9901 Segmental and somatic dysfunction of cervical region: Secondary | ICD-10-CM | POA: Diagnosis not present

## 2020-06-01 DIAGNOSIS — M9903 Segmental and somatic dysfunction of lumbar region: Secondary | ICD-10-CM | POA: Diagnosis not present

## 2020-06-01 DIAGNOSIS — M9902 Segmental and somatic dysfunction of thoracic region: Secondary | ICD-10-CM | POA: Diagnosis not present

## 2020-06-03 DIAGNOSIS — M9902 Segmental and somatic dysfunction of thoracic region: Secondary | ICD-10-CM | POA: Diagnosis not present

## 2020-06-03 DIAGNOSIS — M9903 Segmental and somatic dysfunction of lumbar region: Secondary | ICD-10-CM | POA: Diagnosis not present

## 2020-06-03 DIAGNOSIS — M9904 Segmental and somatic dysfunction of sacral region: Secondary | ICD-10-CM | POA: Diagnosis not present

## 2020-06-03 DIAGNOSIS — M9901 Segmental and somatic dysfunction of cervical region: Secondary | ICD-10-CM | POA: Diagnosis not present

## 2020-06-04 DIAGNOSIS — M9902 Segmental and somatic dysfunction of thoracic region: Secondary | ICD-10-CM | POA: Diagnosis not present

## 2020-06-04 DIAGNOSIS — M9901 Segmental and somatic dysfunction of cervical region: Secondary | ICD-10-CM | POA: Diagnosis not present

## 2020-06-04 DIAGNOSIS — M9903 Segmental and somatic dysfunction of lumbar region: Secondary | ICD-10-CM | POA: Diagnosis not present

## 2020-06-04 DIAGNOSIS — M9904 Segmental and somatic dysfunction of sacral region: Secondary | ICD-10-CM | POA: Diagnosis not present

## 2020-06-08 DIAGNOSIS — M9904 Segmental and somatic dysfunction of sacral region: Secondary | ICD-10-CM | POA: Diagnosis not present

## 2020-06-08 DIAGNOSIS — M9901 Segmental and somatic dysfunction of cervical region: Secondary | ICD-10-CM | POA: Diagnosis not present

## 2020-06-08 DIAGNOSIS — M9902 Segmental and somatic dysfunction of thoracic region: Secondary | ICD-10-CM | POA: Diagnosis not present

## 2020-06-08 DIAGNOSIS — M9903 Segmental and somatic dysfunction of lumbar region: Secondary | ICD-10-CM | POA: Diagnosis not present

## 2020-06-15 DIAGNOSIS — M9903 Segmental and somatic dysfunction of lumbar region: Secondary | ICD-10-CM | POA: Diagnosis not present

## 2020-06-15 DIAGNOSIS — M9901 Segmental and somatic dysfunction of cervical region: Secondary | ICD-10-CM | POA: Diagnosis not present

## 2020-06-15 DIAGNOSIS — M9904 Segmental and somatic dysfunction of sacral region: Secondary | ICD-10-CM | POA: Diagnosis not present

## 2020-06-15 DIAGNOSIS — M9902 Segmental and somatic dysfunction of thoracic region: Secondary | ICD-10-CM | POA: Diagnosis not present

## 2020-06-16 DIAGNOSIS — M9904 Segmental and somatic dysfunction of sacral region: Secondary | ICD-10-CM | POA: Diagnosis not present

## 2020-06-16 DIAGNOSIS — M9901 Segmental and somatic dysfunction of cervical region: Secondary | ICD-10-CM | POA: Diagnosis not present

## 2020-06-16 DIAGNOSIS — M9902 Segmental and somatic dysfunction of thoracic region: Secondary | ICD-10-CM | POA: Diagnosis not present

## 2020-06-16 DIAGNOSIS — M9903 Segmental and somatic dysfunction of lumbar region: Secondary | ICD-10-CM | POA: Diagnosis not present

## 2020-06-17 DIAGNOSIS — L578 Other skin changes due to chronic exposure to nonionizing radiation: Secondary | ICD-10-CM | POA: Diagnosis not present

## 2020-06-17 DIAGNOSIS — D485 Neoplasm of uncertain behavior of skin: Secondary | ICD-10-CM | POA: Diagnosis not present

## 2020-06-17 DIAGNOSIS — L820 Inflamed seborrheic keratosis: Secondary | ICD-10-CM | POA: Diagnosis not present

## 2020-06-21 DIAGNOSIS — M9903 Segmental and somatic dysfunction of lumbar region: Secondary | ICD-10-CM | POA: Diagnosis not present

## 2020-06-21 DIAGNOSIS — M9902 Segmental and somatic dysfunction of thoracic region: Secondary | ICD-10-CM | POA: Diagnosis not present

## 2020-06-21 DIAGNOSIS — M9901 Segmental and somatic dysfunction of cervical region: Secondary | ICD-10-CM | POA: Diagnosis not present

## 2020-06-21 DIAGNOSIS — M9904 Segmental and somatic dysfunction of sacral region: Secondary | ICD-10-CM | POA: Diagnosis not present

## 2020-06-22 DIAGNOSIS — M9901 Segmental and somatic dysfunction of cervical region: Secondary | ICD-10-CM | POA: Diagnosis not present

## 2020-06-22 DIAGNOSIS — M9904 Segmental and somatic dysfunction of sacral region: Secondary | ICD-10-CM | POA: Diagnosis not present

## 2020-06-22 DIAGNOSIS — M9902 Segmental and somatic dysfunction of thoracic region: Secondary | ICD-10-CM | POA: Diagnosis not present

## 2020-06-22 DIAGNOSIS — M9903 Segmental and somatic dysfunction of lumbar region: Secondary | ICD-10-CM | POA: Diagnosis not present

## 2020-06-24 DIAGNOSIS — M9904 Segmental and somatic dysfunction of sacral region: Secondary | ICD-10-CM | POA: Diagnosis not present

## 2020-06-24 DIAGNOSIS — M9903 Segmental and somatic dysfunction of lumbar region: Secondary | ICD-10-CM | POA: Diagnosis not present

## 2020-06-24 DIAGNOSIS — M9901 Segmental and somatic dysfunction of cervical region: Secondary | ICD-10-CM | POA: Diagnosis not present

## 2020-06-24 DIAGNOSIS — M9902 Segmental and somatic dysfunction of thoracic region: Secondary | ICD-10-CM | POA: Diagnosis not present

## 2020-06-30 DIAGNOSIS — M9904 Segmental and somatic dysfunction of sacral region: Secondary | ICD-10-CM | POA: Diagnosis not present

## 2020-06-30 DIAGNOSIS — M9903 Segmental and somatic dysfunction of lumbar region: Secondary | ICD-10-CM | POA: Diagnosis not present

## 2020-06-30 DIAGNOSIS — M9902 Segmental and somatic dysfunction of thoracic region: Secondary | ICD-10-CM | POA: Diagnosis not present

## 2020-06-30 DIAGNOSIS — M9901 Segmental and somatic dysfunction of cervical region: Secondary | ICD-10-CM | POA: Diagnosis not present

## 2020-07-30 ENCOUNTER — Telehealth: Payer: Self-pay | Admitting: Cardiology

## 2020-07-30 DIAGNOSIS — I10 Essential (primary) hypertension: Secondary | ICD-10-CM

## 2020-07-30 DIAGNOSIS — E039 Hypothyroidism, unspecified: Secondary | ICD-10-CM

## 2020-07-30 DIAGNOSIS — R002 Palpitations: Secondary | ICD-10-CM

## 2020-07-30 NOTE — Telephone Encounter (Signed)
Pt called back needing a referral placed so that she can be seen by Dr Cruzita Lederer.  Referral placed as requested.

## 2020-07-30 NOTE — Telephone Encounter (Signed)
    Pt said Dr. Marlou Porch referred her to endocrinologist but she lost the name of the practice. She wanted to get the information again

## 2020-07-30 NOTE — Telephone Encounter (Signed)
Spoke with pt - Dr Marlou Porch had given her the name of Dr Philemon Kingdom.  Called pt back and gave her that information.  She also has decided to move forward with the Coronary Ca score.  Advised order placed and she will be called to be scheduled.  Reviewed no instructions (may drink/eat as normal)

## 2020-07-30 NOTE — Telephone Encounter (Signed)
Patient is calling requesting to speak to [facility] with follow up questions from their previous call today. Please advise.

## 2020-08-19 ENCOUNTER — Other Ambulatory Visit: Payer: Self-pay

## 2020-08-19 MED ORDER — LISINOPRIL 10 MG PO TABS
5.0000 mg | ORAL_TABLET | Freq: Every day | ORAL | 1 refills | Status: DC
Start: 2020-08-19 — End: 2021-05-09

## 2020-08-24 ENCOUNTER — Other Ambulatory Visit: Payer: Self-pay

## 2020-08-24 ENCOUNTER — Ambulatory Visit (INDEPENDENT_AMBULATORY_CARE_PROVIDER_SITE_OTHER)
Admission: RE | Admit: 2020-08-24 | Discharge: 2020-08-24 | Disposition: A | Discharge status: Home / Self Care | Payer: Self-pay | Source: Ambulatory Visit | Attending: Cardiology | Admitting: Cardiology

## 2020-08-24 DIAGNOSIS — I10 Essential (primary) hypertension: Secondary | ICD-10-CM

## 2020-08-24 DIAGNOSIS — R002 Palpitations: Secondary | ICD-10-CM
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
## 2020-08-30 ENCOUNTER — Telehealth: Payer: Self-pay | Admitting: *Deleted

## 2020-08-30 ENCOUNTER — Ambulatory Visit: Payer: BLUE CROSS/BLUE SHIELD | Admitting: Physician Assistant

## 2020-08-30 ENCOUNTER — Encounter: Payer: Self-pay | Admitting: Physician Assistant

## 2020-08-30 ENCOUNTER — Other Ambulatory Visit: Payer: Self-pay

## 2020-08-30 VITALS — BP 108/70 | HR 60 | Ht 64.0 in | Wt 173.6 lb

## 2020-08-30 DIAGNOSIS — R002 Palpitations: Secondary | ICD-10-CM | POA: Diagnosis not present

## 2020-08-30 DIAGNOSIS — E782 Mixed hyperlipidemia: Secondary | ICD-10-CM

## 2020-08-30 DIAGNOSIS — R931 Abnormal findings on diagnostic imaging of heart and coronary circulation: Secondary | ICD-10-CM | POA: Diagnosis not present

## 2020-08-30 DIAGNOSIS — Z8249 Family history of ischemic heart disease and other diseases of the circulatory system: Secondary | ICD-10-CM | POA: Diagnosis not present

## 2020-08-30 NOTE — Telephone Encounter (Signed)
I have never prescribed this for her. She will need to talk with PCP Thanks  Candee Furbish, MD

## 2020-08-30 NOTE — Patient Instructions (Addendum)
Medication Instructions:  Start taking over the counter aspirin 81 mg daily.  *If you need a refill on your cardiac medications before your next appointment, please call your pharmacy*   Lab Work: Lipid panel   If you have labs (blood work) drawn today and your tests are completely normal, you will receive your results only by: Marland Kitchen MyChart Message (if you have MyChart) OR . A paper copy in the mail If you have any lab test that is abnormal or we need to change your treatment, we will call you to review the results.   Testing/Procedures: None   Follow-Up: At [facility], you and your health needs are our priority.  As part of our continuing mission to provide you with exceptional heart care, we have created designated Provider Care Teams.  These Care Teams include your primary Cardiologist (physician) and Advanced Practice Providers (APPs -  Physician Assistants and Nurse Practitioners) who all work together to provide you with the care you need, when you need it.   Your next appointment:   1 year(s)  The format for your next appointment:   In Person  Provider:   Candee Furbish, MD

## 2020-08-30 NOTE — Telephone Encounter (Signed)
Spoke with patient in regards to alprazolam refill request and informed her that per Dr Marlou Porch, she would need to contact her PCP to request this medication to be refilled. She verbalized her understanding and appreciation for the call.

## 2020-08-30 NOTE — Telephone Encounter (Signed)
Patient saw Cephus Shelling today and requested a refill on alprazolam as she will be travelling in April. Per vin defer to PCP, per patient, Dr Marlou Porch usually refills this for her when she travels. Please advise. Thanks, MI

## 2020-09-03 ENCOUNTER — Other Ambulatory Visit: Payer: BLUE CROSS/BLUE SHIELD

## 2020-09-07 ENCOUNTER — Encounter: Payer: Self-pay | Admitting: Internal Medicine

## 2020-09-07 ENCOUNTER — Ambulatory Visit: Payer: BLUE CROSS/BLUE SHIELD | Admitting: Internal Medicine

## 2020-09-07 ENCOUNTER — Other Ambulatory Visit: Payer: Self-pay

## 2020-09-07 VITALS — BP 118/80 | HR 60 | Ht 64.0 in | Wt 172.6 lb

## 2020-09-07 DIAGNOSIS — E038 Other specified hypothyroidism: Secondary | ICD-10-CM | POA: Diagnosis not present

## 2020-09-07 DIAGNOSIS — E063 Autoimmune thyroiditis: Secondary | ICD-10-CM

## 2020-09-07 LAB — T3, FREE: T3, Free: 3.9 pg/mL (ref 2.3–4.2)

## 2020-09-07 LAB — TSH: TSH: 1.49 u[IU]/mL (ref 0.35–4.50)

## 2020-09-07 LAB — T4, FREE: Free T4: 0.76 ng/dL (ref 0.60–1.60)

## 2020-09-07 MED ORDER — LEVOTHYROXINE SODIUM 25 MCG PO TABS: 25.0000 ug | ORAL_TABLET | Freq: Every day | ORAL | 3 refills | Status: DC
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
## 2020-09-07 NOTE — Patient Instructions (Addendum)
Please continue: - Levothyroxine 25 mcg daily - Armour 60 mg daily  Take the thyroid hormone every day, with water, at least 30 minutes before breakfast, separated by at least 4 hours from: - acid reflux medications - calcium - iron - multivitamins  Please return in 6 months but with labs in 3 months.    Hypothyroidism  Hypothyroidism is when the thyroid gland does not make enough of certain hormones (it is underactive). The thyroid gland is a small gland located in the lower front part of the neck, just in front of the windpipe (trachea). This gland makes hormones that help control how the body uses food for energy (metabolism) as well as how the heart and brain function. These hormones also play a role in keeping your bones strong. When the thyroid is underactive, it produces too little of the hormones thyroxine (T4) and triiodothyronine (T3). What are the causes? This condition may be caused by:  Hashimoto's disease. This is a disease in which the body's disease-fighting system (immune system) attacks the thyroid gland. This is the most common cause.  Viral infections.  Pregnancy.  Certain medicines.  Birth defects.  Past radiation treatments to the head or neck for cancer.  Past treatment with radioactive iodine.  Past exposure to radiation in the environment.  Past surgical removal of part or all of the thyroid.  Problems with a gland in the center of the brain (pituitary gland).  Lack of enough iodine in the diet. What increases the risk? You are more likely to develop this condition if:  You are female.  You have a family history of thyroid conditions.  You use a medicine called lithium.  You take medicines that affect the immune system (immunosuppressants). What are the signs or symptoms? Symptoms of this condition include:  Feeling as though you have no energy (lethargy).  Not being able to tolerate cold.  Weight gain that is not explained by a change  in diet or exercise habits.  Lack of appetite.  Dry skin.  Coarse hair.  Menstrual irregularity.  Slowing of thought processes.  Constipation.  Sadness or depression. How is this diagnosed? This condition may be diagnosed based on:  Your symptoms, your medical history, and a physical exam.  Blood tests. You may also have imaging tests, such as an ultrasound or MRI. How is this treated? This condition is treated with medicine that replaces the thyroid hormones that your body does not make. After you begin treatment, it may take several weeks for symptoms to go away. Follow these instructions at home:  Take over-the-counter and prescription medicines only as told by your health care provider.  If you start taking any new medicines, tell your health care provider.  Keep all follow-up visits as told by your health care provider. This is important. ? As your condition improves, your dosage of thyroid hormone medicine may change. ? You will need to have blood tests regularly so that your health care provider can monitor your condition. Contact a health care provider if:  Your symptoms do not get better with treatment.  You are taking thyroid hormone replacement medicine and you: ? Sweat a lot. ? Have tremors. ? Feel anxious. ? Lose weight rapidly. ? Cannot tolerate heat. ? Have emotional swings. ? Have diarrhea. ? Feel weak. Get help right away if you have:  Chest pain.  An irregular heartbeat.  A rapid heartbeat.  Difficulty breathing. Summary  Hypothyroidism is when the thyroid gland does not make  enough of certain hormones (it is underactive).  When the thyroid is underactive, it produces too little of the hormones thyroxine (T4) and triiodothyronine (T3).  The most common cause is Hashimoto's disease, a disease in which the body's disease-fighting system (immune system) attacks the thyroid gland. The condition can also be caused by viral infections,  medicine, pregnancy, or past radiation treatment to the head or neck.  Symptoms may include weight gain, dry skin, constipation, feeling as though you do not have energy, and not being able to tolerate cold.  This condition is treated with medicine to replace the thyroid hormones that your body does not make. This information is not intended to replace advice given to you by your health care provider. Make sure you discuss any questions you have with your health care provider. Document Revised: 03/05/2020 Document Reviewed: 02/19/2020 Elsevier Patient Education  2021 [employer].

## 2020-09-08 ENCOUNTER — Other Ambulatory Visit: Payer: BLUE CROSS/BLUE SHIELD

## 2020-10-29 ENCOUNTER — Encounter: Payer: Self-pay | Admitting: Obstetrics and Gynecology

## 2020-10-29 DIAGNOSIS — Z1231 Encounter for screening mammogram for malignant neoplasm of breast: Secondary | ICD-10-CM | POA: Diagnosis not present

## 2021-01-03 DIAGNOSIS — J029 Acute pharyngitis, unspecified: Secondary | ICD-10-CM | POA: Diagnosis not present

## 2021-01-03 DIAGNOSIS — B349 Viral infection, unspecified: Secondary | ICD-10-CM | POA: Diagnosis not present

## 2021-01-03 DIAGNOSIS — U071 COVID-19: Secondary | ICD-10-CM | POA: Diagnosis not present

## 2021-01-07 ENCOUNTER — Ambulatory Visit: Payer: BLUE CROSS/BLUE SHIELD | Admitting: Internal Medicine

## 2021-01-25 DIAGNOSIS — Z0000 Encounter for general adult medical examination without abnormal findings: Secondary | ICD-10-CM | POA: Diagnosis not present

## 2021-01-25 DIAGNOSIS — R7303 Prediabetes: Secondary | ICD-10-CM | POA: Diagnosis not present

## 2021-01-25 DIAGNOSIS — I10 Essential (primary) hypertension: Secondary | ICD-10-CM | POA: Diagnosis not present

## 2021-01-25 DIAGNOSIS — E7800 Pure hypercholesterolemia, unspecified: Secondary | ICD-10-CM | POA: Diagnosis not present

## 2021-03-03 ENCOUNTER — Ambulatory Visit: Payer: BLUE CROSS/BLUE SHIELD | Admitting: Internal Medicine
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
## 2021-03-11 ENCOUNTER — Ambulatory Visit: Payer: BLUE CROSS/BLUE SHIELD | Admitting: Internal Medicine

## 2021-03-14 ENCOUNTER — Ambulatory Visit (INDEPENDENT_AMBULATORY_CARE_PROVIDER_SITE_OTHER): Payer: BLUE CROSS/BLUE SHIELD | Admitting: Internal Medicine

## 2021-03-14 ENCOUNTER — Other Ambulatory Visit: Payer: Self-pay

## 2021-03-14 ENCOUNTER — Telehealth: Payer: Self-pay | Admitting: Cardiology

## 2021-03-14 ENCOUNTER — Encounter: Payer: Self-pay | Admitting: Internal Medicine

## 2021-03-14 VITALS — BP 122/72 | HR 63 | Ht 64.0 in | Wt 173.4 lb

## 2021-03-14 DIAGNOSIS — E038 Other specified hypothyroidism: Secondary | ICD-10-CM

## 2021-03-14 DIAGNOSIS — E063 Autoimmune thyroiditis: Secondary | ICD-10-CM

## 2021-03-14 MED ORDER — THYROID 60 MG PO TABS: 60.0000 mg | ORAL_TABLET | Freq: Every day | ORAL | 3 refills | Status: DC

## 2021-03-14 NOTE — Telephone Encounter (Signed)
Pt called to report that she has been having left arm "aching" for several weeks.. she is not sure but can be related to starting Rosuvastatin 5 mg prescribed by her PCP... she says it hurts with and without movement.. no numbness or tingling... she denies chest pain, sob, palpitations... she says this is how her mothers symptoms presented when she had an MI.   I have made her ana ppt with Robbie Lis PA for 03/16/21... if her symptoms worsen or progress she will consider going tot he ED.

## 2021-03-14 NOTE — Telephone Encounter (Signed)
Pt is reaching out with complaints about her left arm hurting, pt denies any back or chest pain. Please advise
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
## 2021-03-14 NOTE — Patient Instructions (Addendum)
Please continue: - Levothyroxine 25 mcg daily - Armour 60 mg daily  Take the thyroid hormone every day, with water, at least 30 minutes before breakfast, separated by at least 4 hours from: - acid reflux medications - calcium - iron - multivitamins  Please move the thyroid hormones to am and come back for labs in 5-6 weeks.  Please return in 1 year.

## 2021-03-16 ENCOUNTER — Other Ambulatory Visit: Payer: Self-pay

## 2021-03-16 ENCOUNTER — Encounter: Payer: Self-pay | Admitting: Physician Assistant

## 2021-03-16 ENCOUNTER — Ambulatory Visit: Payer: BLUE CROSS/BLUE SHIELD | Admitting: Physician Assistant

## 2021-03-16 VITALS — BP 128/64 | HR 73 | Ht 64.0 in | Wt 172.8 lb

## 2021-03-16 DIAGNOSIS — R002 Palpitations: Secondary | ICD-10-CM

## 2021-03-16 DIAGNOSIS — M79602 Pain in left arm: Secondary | ICD-10-CM

## 2021-03-16 DIAGNOSIS — Z8249 Family history of ischemic heart disease and other diseases of the circulatory system: Secondary | ICD-10-CM

## 2021-03-16 DIAGNOSIS — R079 Chest pain, unspecified: Secondary | ICD-10-CM | POA: Diagnosis not present

## 2021-03-16 DIAGNOSIS — E782 Mixed hyperlipidemia: Secondary | ICD-10-CM

## 2021-03-16 MED ORDER — METOPROLOL TARTRATE 50 MG PO TABS: ORAL_TABLET | ORAL | 0 refills | Status: DC
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
## 2021-03-16 NOTE — Patient Instructions (Addendum)
Medication Instructions:  Your physician recommends that you continue on your current medications as directed. Please refer to the Current Medication list given to you today.  *If you need a refill on your cardiac medications before your next appointment, please call your pharmacy*   Lab Work: WHEN YOU GET A DATE FOR THE CT, THEY WILL LET YOU KNOW WHEN TO COME FOR:  BMET  If you have labs (blood work) drawn today and your tests are completely normal, you will receive your results only by: [place] (if you have MyChart) OR A paper copy in the mail If you have any lab test that is abnormal or we need to change your treatment, we will call you to review the results.   Testing/Procedures: Your physician has requested that you have cardiac CT. Cardiac computed tomography (CT) is a painless test that uses an x-ray machine to take clear, detailed pictures of your heart. For further information please visit HugeFiesta.[email or web address]. Please follow instruction sheet BELOW:    Your cardiac CT will be scheduled at one of the below locations:   [facility] [address] [place], [place] [address] [phone]  If scheduled at [facility], please arrive at the [facility] main entrance (entrance A) of [facility] 30 minutes prior to test start time. Proceed to the [facility] Radiology Department (first floor) to check-in and test prep.   Please follow these instructions carefully (unless otherwise directed):   On the Night Before the Test: Be sure to Drink plenty of water. Do not consume any caffeinated/decaffeinated beverages or chocolate 12 hours prior to your test. Do not take any antihistamines 12 hours prior to your test.   On the Day of the Test: Drink plenty of water until 1 hour prior to the test. Do not eat any food 4 hours prior to the test. You may take your regular medications prior to the test.  Take metoprolol 90-120 minutes prior to  test. FEMALES- please wear underwire-free bra if available, avoid dresses & tight clothing      After the Test: Drink plenty of water. After receiving IV contrast, you may experience a mild flushed feeling. This is normal. On occasion, you may experience a mild rash up to 24 hours after the test. This is not dangerous. If this occurs, you can take Benadryl 25 mg and increase your fluid intake. If you experience trouble breathing, this can be serious. If it is severe call 911 IMMEDIATELY. If it is mild, please call our office. If you take any of these medications: Glipizide/Metformin, Avandament,   Please allow 2-4 weeks for scheduling of routine cardiac CTs. Some insurance companies require a pre-authorization which may delay scheduling of this test.   For non-scheduling related questions, please contact the cardiac imaging nurse navigator should you have any questions/concerns: Marchia Bond, Cardiac Imaging Nurse Navigator Gordy Clement, Cardiac Imaging Nurse Navigator [place] [facility] Direct Office Dial: [phone]   For scheduling needs, including cancellations and rescheduling, please call [place], [phone].      Follow-Up: At [facility], you and your health needs are our priority.  As part of our continuing mission to provide you with exceptional heart care, we have created designated Provider Care Teams.  These Care Teams include your primary Cardiologist (physician) and Advanced Practice Providers (APPs -  Physician Assistants and Nurse Practitioners) who all work together to provide you with the care you need, when you need it.  We recommend  signing up for the patient portal called "MyChart".  Sign up information is provided on this After Visit Summary.  MyChart is used to connect with patients for Virtual Visits (Telemedicine).  Patients are able to view lab/test results, encounter notes, upcoming appointments, etc.  Non-urgent messages can be  sent to your provider as well.   To learn more about what you can do with MyChart, go to [email or web address].    Your next appointment:   6 month(s) AS SCHEDULED IN MARCH 2023   The format for your next appointment:   In Person  Provider:   Candee Furbish, MD   Other Instructions

## 2021-03-21 ENCOUNTER — Other Ambulatory Visit: Payer: Self-pay

## 2021-03-21 MED ORDER — METOPROLOL SUCCINATE ER 50 MG PO TB24: ORAL_TABLET | ORAL | 3 refills | Status: DC

## 2021-03-25 ENCOUNTER — Other Ambulatory Visit: Payer: BLUE CROSS/BLUE SHIELD | Admitting: *Deleted

## 2021-03-25 ENCOUNTER — Other Ambulatory Visit: Payer: Self-pay

## 2021-03-25 DIAGNOSIS — Z8249 Family history of ischemic heart disease and other diseases of the circulatory system: Secondary | ICD-10-CM

## 2021-03-25 DIAGNOSIS — R079 Chest pain, unspecified: Secondary | ICD-10-CM | POA: Diagnosis not present

## 2021-03-25 DIAGNOSIS — M79602 Pain in left arm: Secondary | ICD-10-CM

## 2021-03-25 LAB — BASIC METABOLIC PANEL
BUN/Creatinine Ratio: 22 (ref 12–28)
BUN: 15 mg/dL (ref 8–27)
CO2: 25 mmol/L (ref 20–29)
Calcium: 9.6 mg/dL (ref 8.7–10.3)
Chloride: 100 mmol/L (ref 96–106)
Creatinine, Ser: 0.69 mg/dL (ref 0.57–1.00)
Glucose: 83 mg/dL (ref 70–99)
Potassium: 4 mmol/L (ref 3.5–5.2)
Sodium: 140 mmol/L (ref 134–144)
eGFR: 97 mL/min/{1.73_m2} (ref >59)

## 2021-03-28 ENCOUNTER — Telehealth: Payer: Self-pay | Admitting: Cardiology

## 2021-03-28 NOTE — Telephone Encounter (Signed)
[facility] was calling to get a prior authorization for the patient's CT and the analysis of the CT.

## 2021-03-30 ENCOUNTER — Telehealth (HOSPITAL_COMMUNITY): Payer: Self-pay | Admitting: Emergency Medicine

## 2021-03-30 NOTE — Telephone Encounter (Signed)
Reaching out to patient to offer assistance regarding upcoming cardiac imaging study; pt verbalizes understanding of appt date/time, parking situation and where to check in, pre-test NPO status and medications ordered, and verified current allergies; name and call back number provided for further questions should they arise Marchia Bond RN Navigator Cardiac Imaging [facility] [phone] office [phone] cell  50mg  metop succ + 50mg  metop tart Denies iv issues Denies claustro

## 2021-03-30 NOTE — Telephone Encounter (Signed)
[place], [place] - 03/28/2021  5:00 PM Ciro Backer  Sent: Mon March 28, 2021  6:05 PM  To: Nuala Alpha, LPN; Alta Corning; Johnna Acosta          Message  Good afternoon, Josem Kaufmann was on file prior to their call. The patient is good to go for her CCTA.

## 2021-04-01 ENCOUNTER — Other Ambulatory Visit (HOSPITAL_COMMUNITY): Payer: Self-pay | Admitting: Emergency Medicine

## 2021-04-01 ENCOUNTER — Ambulatory Visit (HOSPITAL_COMMUNITY)
Admission: RE | Admit: 2021-04-01 | Discharge: 2021-04-01 | Disposition: A | Discharge status: Home / Self Care | Payer: BLUE CROSS/BLUE SHIELD | Source: Ambulatory Visit | Attending: Physician Assistant | Admitting: Physician Assistant

## 2021-04-01 ENCOUNTER — Other Ambulatory Visit: Payer: Self-pay

## 2021-04-01 DIAGNOSIS — R079 Chest pain, unspecified: Secondary | ICD-10-CM

## 2021-04-01 DIAGNOSIS — I2510 Atherosclerotic heart disease of native coronary artery without angina pectoris: Secondary | ICD-10-CM | POA: Diagnosis not present

## 2021-04-01 DIAGNOSIS — R931 Abnormal findings on diagnostic imaging of heart and coronary circulation: Secondary | ICD-10-CM

## 2021-04-01 MED ORDER — IOHEXOL 350 MG/ML SOLN
100.0000 mL | Freq: Once | INTRAVENOUS | Status: AC | PRN
  Administered 2021-04-01: 100 mL via INTRAVENOUS

## 2021-04-01 MED ORDER — NITROGLYCERIN 0.4 MG SL SUBL
SUBLINGUAL_TABLET | SUBLINGUAL | Status: AC
  Filled 2021-04-01: qty 2

## 2021-04-01 MED ORDER — NITROGLYCERIN 0.4 MG SL SUBL
0.8000 mg | SUBLINGUAL_TABLET | Freq: Once | SUBLINGUAL | Status: AC
  Administered 2021-04-01: 0.8 mg via SUBLINGUAL

## 2021-04-03 DIAGNOSIS — I2510 Atherosclerotic heart disease of native coronary artery without angina pectoris: Secondary | ICD-10-CM | POA: Diagnosis not present

## 2021-04-04 ENCOUNTER — Ambulatory Visit (HOSPITAL_COMMUNITY)
Admission: RE | Admit: 2021-04-04 | Discharge: 2021-04-04 | Disposition: A | Discharge status: Home / Self Care | Payer: BLUE CROSS/BLUE SHIELD | Source: Ambulatory Visit | Attending: Physician Assistant | Admitting: Physician Assistant

## 2021-04-04 DIAGNOSIS — R079 Chest pain, unspecified: Secondary | ICD-10-CM | POA: Insufficient documentation

## 2021-04-04 DIAGNOSIS — R931 Abnormal findings on diagnostic imaging of heart and coronary circulation: Secondary | ICD-10-CM | POA: Insufficient documentation

## 2021-04-05 ENCOUNTER — Telehealth: Payer: Self-pay | Admitting: Cardiology

## 2021-04-05 DIAGNOSIS — E782 Mixed hyperlipidemia: Secondary | ICD-10-CM

## 2021-04-05 NOTE — Telephone Encounter (Signed)
Patient calling for CT results. 

## 2021-04-05 NOTE — Telephone Encounter (Signed)
Spoke with pt and made her aware that we are still waiting on Vin to review CT and FFR and give recommendations.  Did mention elevated Calcium Score and asked if pt has ever taken higher doses of statin medication.  States she has not and was taken off of the Rosuvastatin due to arm pain.  Arm pain has resolved since coming off of the medication.  Advised I will update Vin and someone would be in contact once he reviews the test.  Pt appreciative for call.

## 2021-04-08 ENCOUNTER — Telehealth: Payer: Self-pay | Admitting: Cardiology

## 2021-04-08 DIAGNOSIS — H2511 Age-related nuclear cataract, right eye: Secondary | ICD-10-CM | POA: Diagnosis not present

## 2021-04-08 DIAGNOSIS — H43813 Vitreous degeneration, bilateral: Secondary | ICD-10-CM | POA: Diagnosis not present

## 2021-04-08 MED ORDER — ISOSORBIDE MONONITRATE ER 30 MG PO TB24: 15.0000 mg | ORAL_TABLET | Freq: Every day | ORAL | 3 refills | Status: DC

## 2021-04-08 NOTE — Telephone Encounter (Signed)
Leanor Kail, [place]  04/06/2021  1:54 PM EDT     Borderline stenosis of 1st diagonal branch of LAD. Would recommended starting low dose imdur 15mg  daily and follow up in clinic in 2-3 weeks to see response.     RX sent into pharmacy of choice as requested.  Ammie Ferrier)

## 2021-04-08 NOTE — Telephone Encounter (Signed)
Patient called in to get update meds that was supposed to be sent over for her to the pharmacy. Patient states that she is going out of town this evening or in the morning. And would like to have the medication before she leaves. Please advise

## 2021-04-15 NOTE — Telephone Encounter (Signed)
-----   Message from Jerline Pain, MD sent at 04/08/2021  4:13 PM EDT ----- Thanks for the update Let's have her visit our lipid clinic to discuss further lipid lowering options for optimal protection Candee Furbish, MD  ----- Message ----- From: Leanor Kail, [place] Sent: 04/06/2021   1:54 PM EDT To: Jerline Pain, MD, Jeanann Lewandowsky, RMA  Borderline stenosis of 1st diagonal branch of LAD. Would recommended starting low dose imdur 15mg  daily and follow up in clinic in 2-3 weeks to see response.

## 2021-04-18 ENCOUNTER — Telehealth: Payer: Self-pay | Admitting: *Deleted

## 2021-04-18 MED ORDER — ISOSORBIDE MONONITRATE ER 30 MG PO TB24: 30.0000 mg | ORAL_TABLET | Freq: Every day | ORAL | 3 refills | Status: DC

## 2021-04-18 NOTE — Telephone Encounter (Signed)
-----   Message from [place], [place] sent at 04/16/2021  7:21 AM EDT ----- Increase imdur to 30mg . ----- Message ----- From: Jeanann Lewandowsky, RMA Sent: 04/15/2021   9:53 AM EDT To: Jerline Pain, MD, Leanor Kail, PA  Pt has been made aware that Dr. Marlou Porch wanted pt to see someone in the [facility]. She has agreed, will put the referral in.  Pt wanted me to let you know that she is still having that pain in her left arm.  It's not constant and comes and goes.  She did confirm that she started Imdur 15 mg daily.

## 2021-04-25 ENCOUNTER — Other Ambulatory Visit: Payer: Self-pay

## 2021-04-25 ENCOUNTER — Ambulatory Visit (INDEPENDENT_AMBULATORY_CARE_PROVIDER_SITE_OTHER): Payer: BLUE CROSS/BLUE SHIELD | Admitting: Pharmacist

## 2021-04-25 ENCOUNTER — Ambulatory Visit: Payer: BLUE CROSS/BLUE SHIELD | Admitting: Cardiology

## 2021-04-25 ENCOUNTER — Encounter: Payer: Self-pay | Admitting: Cardiology

## 2021-04-25 DIAGNOSIS — E782 Mixed hyperlipidemia: Secondary | ICD-10-CM

## 2021-04-25 DIAGNOSIS — I25119 Atherosclerotic heart disease of native coronary artery with unspecified angina pectoris: Secondary | ICD-10-CM | POA: Diagnosis not present

## 2021-04-25 DIAGNOSIS — R002 Palpitations: Secondary | ICD-10-CM

## 2021-04-25 NOTE — Assessment & Plan Note (Signed)
She has had trouble with her statins in the past.  We are having her sit down with our lipid clinic to discuss options.  LDL has been 162.  Strong family history, mother CABG.  She has coronary artery disease as well.

## 2021-04-25 NOTE — Assessment & Plan Note (Signed)
Continue with Toprol.  Doing well.

## 2021-04-25 NOTE — Patient Instructions (Signed)
Medication Instructions:  The current medical regimen is effective;  continue present plan and medications.  *If you need a refill on your cardiac medications before your next appointment, please call your pharmacy*  Follow-Up: At [facility], you and your health needs are our priority.  As part of our continuing mission to provide you with exceptional heart care, we have created designated Provider Care Teams.  These Care Teams include your primary Cardiologist (physician) and Advanced Practice Providers (APPs -  Physician Assistants and Nurse Practitioners) who all work together to provide you with the care you need, when you need it.  We recommend signing up for the patient portal called "MyChart".  Sign up information is provided on this After Visit Summary.  MyChart is used to connect with patients for Virtual Visits (Telemedicine).  Patients are able to view lab/test results, encounter notes, upcoming appointments, etc.  Non-urgent messages can be sent to your provider as well.   To learn more about what you can do with MyChart, go to [email or web address].    Your next appointment:   1 year(s)  The format for your next appointment:   In Person  Provider:   Mark Skains, MD   Thank you for choosing [facility]!!    

## 2021-04-25 NOTE — Assessment & Plan Note (Signed)
Showed her pictures of her CT scan as well as FFR analysis.  LAD and circumflex calcifications.  Small caliber RCA.  She has tried isosorbide 15 mg.  She has not increased.  She does have some left arm pain right in line with her biceps tendon.  She does think that perhaps the isosorbide has helped with this discomfort.  Excellent.
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
## 2021-04-25 NOTE — Patient Instructions (Addendum)
I will submit a prior authorization for Repatha or Praluent  I will call you once I hear back  Call me at [phone] with any questions
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
## 2021-04-26 NOTE — Addendum Note (Signed)
Addended by: Marcelle Overlie D on: 04/26/2021 08:54 AM   Modules accepted: Level of Service

## 2021-04-28 ENCOUNTER — Other Ambulatory Visit: Payer: BLUE CROSS/BLUE SHIELD

## 2021-04-28 ENCOUNTER — Telehealth: Payer: Self-pay | Admitting: Pharmacist

## 2021-04-28 NOTE — Telephone Encounter (Signed)
Called pt. Advised that Repatha was denied. Insurance wants her to try 2 statins. She was about to walk into an exercise class. Requested to call me back tomorrow. Advised I will be out of the office tomorrow but she could call back Monday.

## 2021-05-09 ENCOUNTER — Other Ambulatory Visit: Payer: Self-pay

## 2021-05-09 MED ORDER — LISINOPRIL 10 MG PO TABS: 5.0000 mg | ORAL_TABLET | Freq: Every day | ORAL | 3 refills | Status: DC

## 2021-08-31 ENCOUNTER — Ambulatory Visit: Payer: BLUE CROSS/BLUE SHIELD | Admitting: Cardiology

## 2021-09-05 ENCOUNTER — Other Ambulatory Visit: Payer: Self-pay | Admitting: Internal Medicine

## 2021-09-06 DIAGNOSIS — H33301 Unspecified retinal break, right eye: Secondary | ICD-10-CM | POA: Diagnosis not present

## 2021-09-20 ENCOUNTER — Ambulatory Visit: Payer: BLUE CROSS/BLUE SHIELD | Admitting: Cardiology
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
## 2021-09-28 ENCOUNTER — Telehealth: Payer: Self-pay | Admitting: Obstetrics and Gynecology

## 2021-09-28 ENCOUNTER — Ambulatory Visit (INDEPENDENT_AMBULATORY_CARE_PROVIDER_SITE_OTHER): Payer: BLUE CROSS/BLUE SHIELD | Admitting: Obstetrics and Gynecology

## 2021-09-28 ENCOUNTER — Encounter: Payer: Self-pay | Admitting: Obstetrics and Gynecology

## 2021-09-28 VITALS — BP 130/68 | HR 67 | Ht 64.75 in | Wt 185.0 lb

## 2021-09-28 DIAGNOSIS — Z01419 Encounter for gynecological examination (general) (routine) without abnormal findings: Secondary | ICD-10-CM | POA: Diagnosis not present

## 2021-09-28 NOTE — Telephone Encounter (Signed)
Please make a referral to Dr. Briscoe Deutscher, now with [facility], for Healthy Weight Management.  ? ?My patient is established at [facility] and would like assistance with weight management.  ?
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
## 2021-09-28 NOTE — Patient Instructions (Signed)

## 2021-09-29 NOTE — Telephone Encounter (Signed)
I spoke with Dr Danae Chen [facility] referral coordinator, [facility], at her new office at [facility]. ? ?Patient has [facility] and they are still trying to work out the details and get details finalized to be able to accept patients with [facility]. ? ?She took our patient's name and phone # and will call them to explain the situation. She thinks it will likely be a few weeks before she can schedule patients with [employer].  ? ?Referral will be faxed to her at [phone]. ?

## 2021-09-29 NOTE — Telephone Encounter (Signed)
Thank you for the update!

## 2021-10-21 ENCOUNTER — Ambulatory Visit (INDEPENDENT_AMBULATORY_CARE_PROVIDER_SITE_OTHER): Payer: BLUE CROSS/BLUE SHIELD | Admitting: Cardiology

## 2021-10-21 ENCOUNTER — Encounter (HOSPITAL_BASED_OUTPATIENT_CLINIC_OR_DEPARTMENT_OTHER): Payer: Self-pay | Admitting: Cardiology

## 2021-10-21 VITALS — BP 102/60 | HR 63 | Ht 64.5 in | Wt 184.0 lb

## 2021-10-21 DIAGNOSIS — I25119 Atherosclerotic heart disease of native coronary artery with unspecified angina pectoris: Secondary | ICD-10-CM

## 2021-10-21 DIAGNOSIS — Z79899 Other long term (current) drug therapy: Secondary | ICD-10-CM

## 2021-10-21 DIAGNOSIS — E782 Mixed hyperlipidemia: Secondary | ICD-10-CM | POA: Diagnosis not present

## 2021-10-21 MED ORDER — ROSUVASTATIN CALCIUM 20 MG PO TABS: 20.0000 mg | ORAL_TABLET | Freq: Every day | ORAL | 3 refills | Status: DC

## 2021-10-21 NOTE — Assessment & Plan Note (Signed)
LAD and circumflex calcifications with small caliber RCA.  We will go ahead and go stop the isosorbide. ?We will start Crestor 20 mg once a day.  She is concerned that her arms will start to hurt with this medication.  I reassured her.  There is been trials that show no significant difference between placebo and statin use and discomfort.  Initially asked her to speak once again with our lipid clinic but she said that she did not wish to to do this because it cost her $125. ?Unfortunately her [employer] will not approve a statin alternative at this point.  We will check lipid panel in 2 months. ? ?
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
## 2021-10-21 NOTE — Assessment & Plan Note (Signed)
As above.

## 2021-10-21 NOTE — Patient Instructions (Signed)
Medication Instructions:  ?Please discontinue your Isosorbide. ?Start Crestor 20 mg daily. ?Continue all other medications as listed. ? ?*If you need a refill on your cardiac medications before your next appointment, please call your pharmacy* ? ? ?Lab Work: ?Please have blood work in 2 months (Lipid) ? ?If you have labs (blood work) drawn today and your tests are completely normal, you will receive your results only by: ?MyChart Message (if you have MyChart) OR ?A paper copy in the mail ?If you have any lab test that is abnormal or we need to change your treatment, we will call you to review the results. ? ?Follow-Up: ?At [facility], you and your health needs are our priority.  As part of our continuing mission to provide you with exceptional heart care, we have created designated Provider Care Teams.  These Care Teams include your primary Cardiologist (physician) and Advanced Practice Providers (APPs -  Physician Assistants and Nurse Practitioners) who all work together to provide you with the care you need, when you need it. ? ?We recommend signing up for the patient portal called "MyChart".  Sign up information is provided on this After Visit Summary.  MyChart is used to connect with patients for Virtual Visits (Telemedicine).  Patients are able to view lab/test results, encounter notes, upcoming appointments, etc.  Non-urgent messages can be sent to your provider as well.   ?To learn more about what you can do with MyChart, go to [email or web address].   ? ?Your next appointment:   ?1 year(s) ? ?The format for your next appointment:   ?In Person ? ?Provider:   ?Candee Furbish, MD { ? ?nt Information About Sugar ? ? ? ? ?  ?

## 2021-10-27 ENCOUNTER — Other Ambulatory Visit: Payer: Self-pay | Admitting: Cardiology

## 2021-10-27 DIAGNOSIS — E7800 Pure hypercholesterolemia, unspecified: Secondary | ICD-10-CM

## 2021-10-27 MED ORDER — ATORVASTATIN CALCIUM 20 MG PO TABS: 20.0000 mg | ORAL_TABLET | Freq: Every day | ORAL | 3 refills | Status: DC

## 2021-11-04 DIAGNOSIS — Z1231 Encounter for screening mammogram for malignant neoplasm of breast: Secondary | ICD-10-CM | POA: Diagnosis not present

## 2021-11-04 DIAGNOSIS — M85851 Other specified disorders of bone density and structure, right thigh: Secondary | ICD-10-CM | POA: Diagnosis not present

## 2021-11-04 DIAGNOSIS — M85852 Other specified disorders of bone density and structure, left thigh: Secondary | ICD-10-CM | POA: Diagnosis not present

## 2021-11-07 ENCOUNTER — Encounter: Payer: Self-pay | Admitting: Obstetrics and Gynecology

## 2021-11-08 ENCOUNTER — Encounter: Payer: Self-pay | Admitting: Obstetrics and Gynecology

## 2021-11-10 NOTE — Telephone Encounter (Signed)
I spoke with patient and informed her of the credentialing delays with [employer] that is delaying her referral to Dr. Shelby Dubin at [facility].  Per their referral coordinator [place] told them is may take a couple more months. When the are credentialed she will start contacting the [place] patients she is holding to schedule in the order that referrals were received starting in March.  Patient was informed of all this but said there is no urgency about this and she would prefer to wait on this provider. I told her I will follow along and follow up accordingly.

## 2021-11-10 NOTE — Telephone Encounter (Signed)
I would go ahead and close this encounter and place her in a recall system.

## 2021-11-10 NOTE — Telephone Encounter (Signed)
I spoke with Dr. Alcario Drought office. They are still waiting on their contract to be finalized with [employer]. They are offering patient's to be seen and they will hold claim until all is finalized with the understanding if something happened and it did not finalize patient would be responsible for balance of visit.  I left message with [facility] the referral coordinator to see if she has spoke with patient.

## 2021-12-04 NOTE — Telephone Encounter (Signed)
I have closed this encounter.

## 2021-12-26 ENCOUNTER — Encounter: Payer: Self-pay | Admitting: Cardiology

## 2021-12-30 ENCOUNTER — Other Ambulatory Visit: Payer: BLUE CROSS/BLUE SHIELD

## 2021-12-30 DIAGNOSIS — E782 Mixed hyperlipidemia: Secondary | ICD-10-CM

## 2021-12-30 DIAGNOSIS — Z79899 Other long term (current) drug therapy: Secondary | ICD-10-CM

## 2021-12-30 LAB — LIPID PANEL
Chol/HDL Ratio: 2.7 ratio (ref 0.0–4.4)
Cholesterol, Total: 146 mg/dL (ref 100–199)
HDL: 54 mg/dL (ref >39)
LDL Chol Calc (NIH): 74 mg/dL (ref 0–99)
Triglycerides: 96 mg/dL (ref 0–149)
VLDL Cholesterol Cal: 18 mg/dL (ref 5–40)

## 2022-01-01 DIAGNOSIS — S92354A Nondisplaced fracture of fifth metatarsal bone, right foot, initial encounter for closed fracture: Secondary | ICD-10-CM | POA: Diagnosis not present

## 2022-01-04 DIAGNOSIS — S92354D Nondisplaced fracture of fifth metatarsal bone, right foot, subsequent encounter for fracture with routine healing: Secondary | ICD-10-CM | POA: Diagnosis not present

## 2022-01-06 DIAGNOSIS — X58XXXA Exposure to other specified factors, initial encounter: Secondary | ICD-10-CM | POA: Diagnosis not present

## 2022-01-06 DIAGNOSIS — G8918 Other acute postprocedural pain: Secondary | ICD-10-CM | POA: Diagnosis not present

## 2022-01-06 DIAGNOSIS — Y999 Unspecified external cause status: Secondary | ICD-10-CM | POA: Diagnosis not present

## 2022-01-06 DIAGNOSIS — S92351A Displaced fracture of fifth metatarsal bone, right foot, initial encounter for closed fracture: Secondary | ICD-10-CM | POA: Diagnosis not present

## 2022-01-06 HISTORY — PX: FOOT SURGERY: SHX648

## 2022-01-16 DIAGNOSIS — S92351D Displaced fracture of fifth metatarsal bone, right foot, subsequent encounter for fracture with routine healing: Secondary | ICD-10-CM | POA: Diagnosis not present

## 2022-02-10 ENCOUNTER — Emergency Department (HOSPITAL_COMMUNITY): Payer: BLUE CROSS/BLUE SHIELD

## 2022-02-10 ENCOUNTER — Encounter (HOSPITAL_COMMUNITY): Payer: Self-pay

## 2022-02-10 ENCOUNTER — Other Ambulatory Visit: Payer: Self-pay

## 2022-02-10 ENCOUNTER — Emergency Department (HOSPITAL_COMMUNITY)
Admission: EM | Admit: 2022-02-10 | Discharge: 2022-02-11 | Disposition: A | Discharge status: Home / Self Care | Payer: BLUE CROSS/BLUE SHIELD | Attending: Emergency Medicine | Admitting: Emergency Medicine

## 2022-02-10 DIAGNOSIS — E039 Hypothyroidism, unspecified: Secondary | ICD-10-CM | POA: Insufficient documentation

## 2022-02-10 DIAGNOSIS — R110 Nausea: Secondary | ICD-10-CM | POA: Diagnosis not present

## 2022-02-10 DIAGNOSIS — R778 Other specified abnormalities of plasma proteins: Secondary | ICD-10-CM | POA: Insufficient documentation

## 2022-02-10 DIAGNOSIS — Z79899 Other long term (current) drug therapy: Secondary | ICD-10-CM | POA: Diagnosis not present

## 2022-02-10 DIAGNOSIS — R002 Palpitations: Secondary | ICD-10-CM | POA: Diagnosis not present

## 2022-02-10 DIAGNOSIS — R42 Dizziness and giddiness: Secondary | ICD-10-CM | POA: Diagnosis not present

## 2022-02-10 DIAGNOSIS — I10 Essential (primary) hypertension: Secondary | ICD-10-CM | POA: Insufficient documentation

## 2022-02-10 LAB — CBC WITH DIFFERENTIAL/PLATELET
Abs Immature Granulocytes: 0.02 10*3/uL (ref 0.00–0.07)
Basophils Absolute: 0 10*3/uL (ref 0.0–0.1)
Basophils Relative: 0 %
Eosinophils Absolute: 0 10*3/uL (ref 0.0–0.5)
Eosinophils Relative: 1 %
HCT: 36.4 % (ref 36.0–46.0)
Hemoglobin: 11.8 g/dL — ABNORMAL LOW (ref 12.0–15.0)
Immature Granulocytes: 0 %
Lymphocytes Relative: 18 %
Lymphs Abs: 1.4 10*3/uL (ref 0.7–4.0)
MCH: 30 pg (ref 26.0–34.0)
MCHC: 32.4 g/dL (ref 30.0–36.0)
MCV: 92.6 fL (ref 80.0–100.0)
Monocytes Absolute: 0.7 10*3/uL (ref 0.1–1.0)
Monocytes Relative: 10 %
Neutro Abs: 5.3 10*3/uL (ref 1.7–7.7)
Neutrophils Relative %: 71 %
Platelets: 247 10*3/uL (ref 150–400)
RBC: 3.93 MIL/uL (ref 3.87–5.11)
RDW: 13.5 % (ref 11.5–15.5)
WBC: 7.4 10*3/uL (ref 4.0–10.5)
nRBC: 0 % (ref 0.0–0.2)

## 2022-02-10 LAB — COMPREHENSIVE METABOLIC PANEL
ALT: 20 U/L (ref 0–44)
AST: 15 U/L (ref 15–41)
Albumin: 3.7 g/dL (ref 3.5–5.0)
Alkaline Phosphatase: 80 U/L (ref 38–126)
Anion gap: 8 (ref 5–15)
BUN: 18 mg/dL (ref 8–23)
CO2: 24 mmol/L (ref 22–32)
Calcium: 9.1 mg/dL (ref 8.9–10.3)
Chloride: 105 mmol/L (ref 98–111)
Creatinine, Ser: 0.81 mg/dL (ref 0.44–1.00)
GFR, Estimated: >60 mL/min (ref >60)
Glucose, Bld: 131 mg/dL — ABNORMAL HIGH (ref 70–99)
Potassium: 3.6 mmol/L (ref 3.5–5.1)
Sodium: 137 mmol/L (ref 135–145)
Total Bilirubin: 0.3 mg/dL (ref 0.3–1.2)
Total Protein: 6.4 g/dL — ABNORMAL LOW (ref 6.5–8.1)

## 2022-02-10 LAB — MAGNESIUM: Magnesium: 2 mg/dL (ref 1.7–2.4)

## 2022-02-10 LAB — TROPONIN I (HIGH SENSITIVITY): Troponin I (High Sensitivity): 111 ng/L (ref <18)

## 2022-02-10 NOTE — ED Triage Notes (Signed)
Pt from home with [facility], patient eating ice cream and started feeling lightheaded, nauseous, dizzy and clammy. Patient's apple watch alerted that her HR was 168 at that time as well. Patient has no other complains, NAD, no vomiting.

## 2022-02-10 NOTE — ED Provider Notes (Signed)
[facility] EMERGENCY DEPARTMENT Provider Note   CSN: [id number] Arrival date & time: 02/10/22  1952     History {Add pertinent medical, surgical, social history, OB history to HPI:1} Chief Complaint  Patient presents with   Palpitations    W/ nausea and dizziness    Madeline Garza is a 66 y.o. female.  Patient is a 66 year old female with a history of hypertension, hyperlipidemia, hypothyroidism and prediabetes who is presenting today with complaints of sudden onset of heart palpitations, sweatiness, feeling like she might pass out that started between 6 and 7 PM today when she was at her neighbor's house having dinner.  She reports having a normal day prior to this and symptoms started suddenly.  She reports she suddenly had a ominous feeling where she generally felt terrible and she felt her heart racing.  She told EMS she had chest pain however she denied having any chest pain when asked here.  She reports the only shortness of breath she had was feeling from being nervous.  She reports she has had symptoms like this before but it has been a while.  She did recently have a foot surgery where she received a pin 5 weeks ago but has not had any calf pain or swelling.  She does not take anticoagulation.  She has a significant family history of multiple members in her family with MIs, CABGs and stents but she herself has never had cardiac issues.  She does not use tobacco products or drink excessive amounts of alcohol.  She has had no recent medication changes.  She denies any cough congestion or recent infectious symptoms.  She has not noticed exertional dyspnea at all over the last 5 weeks.  She reports currently she just feels a little bit tired but denies any palpitations at this time.  She was wearing an Apple Watch when this event happened and reported that her watch that her heart rate was 168 bpm.  She also was hypertensive at the scene.  EMS is tracing show heart rates in  the low 100s and the highest was documented at 120.  Based on their notes she was always in the sinus rhythm.  The history is provided by the patient and the EMS personnel.  Palpitations      Home Medications Prior to Admission medications   Medication Sig Start Date End Date Taking? Authorizing Provider  albuterol (VENTOLIN HFA) 108 (90 Base) MCG/ACT inhaler Inhale 2 puffs into the lungs every 4 (four) hours as needed for wheezing or shortness of breath.     [provider]  ALPRAZolam Duanne Moron) 0.5 MG tablet Take 0.5 mg by mouth every 8 (eight) hours as needed for anxiety.  07/16/17   [provider]  Ascorbic Acid (VITAMIN C) 1000 MG tablet Take 1,000 mg by mouth daily.    [provider]  atorvastatin (LIPITOR) 20 MG tablet Take 1 tablet (20 mg total) by mouth daily. 10/27/21 10/22/22  Jerline Pain, MD  B Complex-C (B-COMPLEX WITH VITAMIN C) tablet Take 1 tablet by mouth daily.    [provider]  lisinopril (ZESTRIL) 10 MG tablet Take 0.5 tablets (5 mg total) by mouth daily. 05/09/21   Jerline Pain, MD  metoprolol succinate (TOPROL-XL) 50 MG 24 hr tablet TAKE 1/2 TABLET BY MOUTH DAILY 03/21/21   Jerline Pain, MD  OVER THE COUNTER MEDICATION Take 7,500 Units by mouth daily. Vitamin D 188 mcg (7500 units)    [provider]  Probiotic Product (PROBIOTIC ADVANCED PO) Take 1 capsule by mouth at bedtime.    [provider]  thyroid (ARMOUR) 60 MG tablet Take 1 tablet (60 mg total) by mouth daily before breakfast. 03/14/21   Philemon Kingdom, MD      Allergies    Hctz [hydrochlorothiazide], Tetracyclines & related, and Tape    Review of Systems   Review of Systems  Cardiovascular:  Positive for palpitations.    Physical Exam Updated Vital Signs BP (!) 153/68   Pulse 70   Temp 98.5 F (36.9 C) (Oral)   Resp 16   LMP 06/19/2005 (Within Months)   SpO2 98%  Physical Exam Vitals and nursing note reviewed.  Constitutional:       General: She is not in acute distress.    Appearance: She is well-developed.  HENT:     Head: Normocephalic and atraumatic.  Eyes:     Pupils: Pupils are equal, round, and reactive to light.  Cardiovascular:     Rate and Rhythm: Normal rate and regular rhythm.     Heart sounds: Normal heart sounds. No murmur heard.    No friction rub.  Pulmonary:     Effort: Pulmonary effort is normal.     Breath sounds: Normal breath sounds. No wheezing or rales.  Abdominal:     General: Bowel sounds are normal. There is no distension.     Palpations: Abdomen is soft.     Tenderness: There is no abdominal tenderness. There is no guarding or rebound.  Musculoskeletal:        General: No tenderness. Normal range of motion.     Comments: No edema.  Right well-healing lateral foot surgery without erythema.  No calf tenderness or swelling  Skin:    General: Skin is warm and dry.     Findings: No rash.  Neurological:     Mental Status: She is alert and oriented to person, place, and time. Mental status is at baseline.     Cranial Nerves: No cranial nerve deficit.  Psychiatric:        Behavior: Behavior normal.     ED Results / Procedures / Treatments   Labs (all labs ordered are listed, but only abnormal results are displayed) Labs Reviewed  CBC WITH DIFFERENTIAL/PLATELET  COMPREHENSIVE METABOLIC PANEL  MAGNESIUM  TROPONIN I (HIGH SENSITIVITY)    EKG None  Radiology DG Chest 2 View  Result Date: 02/10/2022 CLINICAL DATA:  Palpitations. EXAM: CHEST - 2 VIEW COMPARISON:  None Available. FINDINGS: The heart size and mediastinal contours are within normal limits. Both lungs are clear. The visualized skeletal structures are unremarkable. IMPRESSION: No active cardiopulmonary disease. Electronically Signed   By: San Morelle M.D.   On: 02/10/2022 21:25    Procedures Procedures  {Document cardiac monitor, telemetry assessment procedure when appropriate:1}  Medications Ordered in  ED Medications - No data to display  ED Course/ Medical Decision Making/ A&P                           Medical Decision Making Amount and/or Complexity of Data Reviewed Labs: ordered. Radiology: ordered.   Pt with multiple medical problems and comorbidities and presenting today with a complaint that caries a high risk for morbidity and mortality.  Here today with sudden onset of heart racing episode and feeling near syncopal.  Patient has had episodes of palpitations in the past but it has been a while.  She does take metoprolol and lisinopril regularly and has not missed any doses.  She has not taken them today but typically takes them in the evening.  She denies any infectious symptoms.  Low suspicion for PE at this time.  Patient did have a foot surgery approximately 5 weeks ago but it was outpatient there was a small pin placed.  She has been in a walking boot but she has no calf tenderness or swelling noted.  She denies any shortness of breath or exertional dyspnea recently.  She currently has a heart rate in the 80s and improved without intervention.  Low suspicion for an acute infection or dehydration.  Will ensure patient has normal electrolytes.  Her EKG which I independently interpreted here is within normal limits.  I have independently visualized and interpreted pt's images today. Chest x-ray is within normal limits.   {Document critical care time when appropriate:1} {Document review of labs and clinical decision tools ie heart score, Chads2Vasc2 etc:1}  {Document your independent review of radiology images, and any outside records:1} {Document your discussion with family members, caretakers, and with consultants:1} {Document social determinants of health affecting pt's care:1} {Document your decision making why or why not admission, treatments were needed:1} Final Clinical Impression(s) / ED Diagnoses Final diagnoses:  None    Rx / DC Orders ED Discharge Orders     None

## 2022-02-11 LAB — TROPONIN I (HIGH SENSITIVITY): Troponin I (High Sensitivity): 129 ng/L (ref <18)

## 2022-02-11 MED ORDER — METOPROLOL SUCCINATE ER 25 MG PO TB24: 12.5000 mg | ORAL_TABLET | Freq: Every day | ORAL | 0 refills | Status: DC

## 2022-02-11 NOTE — ED Notes (Signed)
MD at the bedside to consult pt and family

## 2022-02-11 NOTE — ED Provider Notes (Signed)
I received the patient in signout from Dr. Maryan Rued, briefly patient had an episode of palpitations.  No appreciable arrhythmia with EMS or upon arrival here.  Plan to obtain blood work and reassess.  Patient's troponin is elevated, 111.  I discussed this with the cardiology fellow on-call, Dr. Kalman Shan felt most consistent with SVT.  Recommended repeat troponin.  Repeat troponin slightly higher at 129.  Discussed with Dr. Kalman Shan, felt of the patient not having any active symptoms still most likely consistent with SVT.  Recommended increasing her metoprolol and having her follow-up in the clinic.  She is on 25 mg of long-acting, will add 12.5.  I discussed results with the patient.  She would like to go home.   Deno Etienne, DO 02/11/22 0122

## 2022-02-11 NOTE — Discharge Instructions (Addendum)
I prescribed you more of your beta-blocker.  You will take half a tablet of your 50 mg then half a tablet of your 25 mg.  Please follow-up with your cardiologist in the office.  Please return for recurrent or persistent symptoms.  Also return for chest pain difficulty breathing especially upon exercising.

## 2022-02-13 ENCOUNTER — Telehealth: Payer: Self-pay | Admitting: Cardiology

## 2022-02-13 ENCOUNTER — Encounter: Payer: Self-pay | Admitting: Cardiology

## 2022-02-13 DIAGNOSIS — S92351D Displaced fracture of fifth metatarsal bone, right foot, subsequent encounter for fracture with routine healing: Secondary | ICD-10-CM | POA: Diagnosis not present

## 2022-02-13 NOTE — Telephone Encounter (Signed)
Need Dr. Marlou Porch to look at results from when patient was in ER this weekend with high pulse and BP to see if she needs to come in for a visit
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
## 2022-02-13 NOTE — Telephone Encounter (Signed)
Left message for patient to call back  

## 2022-02-13 NOTE — Telephone Encounter (Signed)
Communicated w patient via [place].  She is coming for visit tomorrow w APP.

## 2022-02-14 ENCOUNTER — Encounter: Payer: Self-pay | Admitting: Nurse Practitioner

## 2022-02-14 ENCOUNTER — Ambulatory Visit: Payer: BLUE CROSS/BLUE SHIELD | Attending: Nurse Practitioner | Admitting: Nurse Practitioner

## 2022-02-14 VITALS — BP 128/62 | HR 75 | Ht 64.0 in | Wt 183.0 lb

## 2022-02-14 DIAGNOSIS — I10 Essential (primary) hypertension: Secondary | ICD-10-CM

## 2022-02-14 DIAGNOSIS — R002 Palpitations: Secondary | ICD-10-CM | POA: Diagnosis not present

## 2022-02-14 DIAGNOSIS — I471 Supraventricular tachycardia, unspecified: Secondary | ICD-10-CM

## 2022-02-14 DIAGNOSIS — M6281 Muscle weakness (generalized): Secondary | ICD-10-CM | POA: Diagnosis not present

## 2022-02-14 DIAGNOSIS — R778 Other specified abnormalities of plasma proteins: Secondary | ICD-10-CM | POA: Diagnosis not present

## 2022-02-14 DIAGNOSIS — E785 Hyperlipidemia, unspecified: Secondary | ICD-10-CM | POA: Diagnosis not present

## 2022-02-14 DIAGNOSIS — R7989 Other specified abnormal findings of blood chemistry: Secondary | ICD-10-CM

## 2022-02-14 DIAGNOSIS — S92351D Displaced fracture of fifth metatarsal bone, right foot, subsequent encounter for fracture with routine healing: Secondary | ICD-10-CM | POA: Diagnosis not present

## 2022-02-14 DIAGNOSIS — M25671 Stiffness of right ankle, not elsewhere classified: Secondary | ICD-10-CM | POA: Diagnosis not present

## 2022-02-14 DIAGNOSIS — R262 Difficulty in walking, not elsewhere classified: Secondary | ICD-10-CM | POA: Diagnosis not present

## 2022-02-14 LAB — TSH: TSH: 0.298 u[IU]/mL — ABNORMAL LOW (ref 0.450–4.500)

## 2022-02-14 MED ORDER — METOPROLOL TARTRATE 25 MG PO TABS: ORAL_TABLET | ORAL | 6 refills | Status: DC

## 2022-02-14 NOTE — Patient Instructions (Addendum)
Medication Instructions:   START Lopressor half tablet - one tablet (12.5 mg - 25 mg ) as needed for palpitations.   *If you need a refill on your cardiac medications before your next appointment, please call your pharmacy*   Lab Work:  TODAY!!!  TSH!!!   If you have labs (blood work) drawn today and your tests are completely normal, you will receive your results only by: [place] (if you have MyChart) OR A paper copy in the mail If you have any lab test that is abnormal or we need to change your treatment, we will call you to review the results.   Testing/Procedures:  Your physician has requested that you have an echocardiogram. Echocardiography is a painless test that uses sound waves to create images of your heart. It provides your doctor with information about the size and shape of your heart and how well your heart's chambers and valves are working. This procedure takes approximately one hour. There are no restrictions for this procedure. [employer] Cardiac Event Monitor Instructions Your physician has requested you wear your cardiac event monitor for ___30__ days, (1-30). [employer] may call or text to confirm a shipping address. The monitor will be sent to a land address via UPS. [employer] will not ship a monitor to a PO BOX. It typically takes 3-5 days to receive your monitor after it has been enrolled. [employer] will assist with USPS tracking if your package is delayed. The telephone number for [employer] is [phone]. Once you have received your monitor, please review the enclosed instructions. Instruction tutorials can also be viewed under help and settings on the enclosed cell phone. Your monitor has already been registered assigning a specific monitor serial # to you.  Applying the monitor Remove cell phone from case and turn it on. The cell phone works as Dealer and needs to be within [employer] of you at all times. The cell phone will need to be  charged on a daily basis. We recommend you plug the cell phone into the enclosed charger at your bedside table every night.  Monitor batteries: You will receive two monitor batteries labelled #1 and #2. These are your recorders. Plug battery #2 onto the second connection on the enclosed charger. Keep one battery on the charger at all times. This will keep the monitor battery deactivated. It will also keep it fully charged for when you need to switch your monitor batteries. A small light will be blinking on the battery emblem when it is charging. The light on the battery emblem will remain on when the battery is fully charged.  Open package of a Monitor strip. Insert battery #1 into black hood on strip and gently squeeze monitor battery onto connection as indicated in instruction booklet. Set aside while preparing skin.  Choose location for your strip, vertical or horizontal, as indicated in the instruction booklet. Shave to remove all hair from location. There cannot be any lotions, oils, powders, or colognes on skin where monitor is to be applied. Wipe skin clean with enclosed Saline wipe. Dry skin completely.  Peel paper labeled #1 off the back of the Monitor strip exposing the adhesive. Place the monitor on the chest in the vertical or horizontal position shown in the instruction booklet. One arrow on the monitor strip must be pointing upward. Carefully remove paper labeled #2, attaching remainder of strip to your skin. Try not to create any folds or wrinkles in the strip as you apply it.  Firmly press and  release the circle in the center of the monitor battery. You will hear a small beep. This is turning the monitor battery on. The heart emblem on the monitor battery will light up every 5 seconds if the monitor battery in turned on and connected to the patient securely. Do not push and hold the circle down as this turns the monitor battery off. The cell phone will locate the monitor  battery. A screen will appear on the cell phone checking the connection of your monitor strip. This may read poor connection initially but change to good connection within the next minute. Once your monitor accepts the connection you will hear a series of 3 beeps followed by a climbing crescendo of beeps. A screen will appear on the cell phone showing the two monitor strip placement options. Touch the picture that demonstrates where you applied the monitor strip.  Your monitor strip and battery are waterproof. You are able to shower, bathe, or swim with the monitor on. They just ask you do not submerge deeper than 3 feet underwater. We recommend removing the monitor if you are swimming in a lake, river, or ocean.  Your monitor battery will need to be switched to a fully charged monitor battery approximately once a week. The cell phone will alert you of an action which needs to be made.  On the cell phone, tap for details to reveal connection status, monitor battery status, and cell phone battery status. The green dots indicates your monitor is in good status. A red dot indicates there is something that needs your attention.  To record a symptom, click the circle on the monitor battery. In 30-60 seconds a list of symptoms will appear on the cell phone. Select your symptom and tap save. Your monitor will record a sustained or significant arrhythmia regardless of you clicking the button. Some patients do not feel the heart rhythm irregularities. [employer] will notify us of any serious or critical events.  Refer to instruction booklet for instructions on switching batteries, changing strips, the Do not disturb or Pause features, or any additional questions.  Call [employer] at [phone], to confirm your monitor is transmitting and record your baseline. They will answer any questions you may have regarding the monitor instructions at that time.  Returning the monitor to [place]  all equipment back into blue box. Peel off strip of paper to expose adhesive and close box securely. There is a prepaid UPS shipping label on this box. Drop in a UPS drop box, or at a UPS facility like [employer]. You may also contact [employer] to arrange UPS to pick up monitor package at your home.     Follow-Up: At [facility], you and your health needs are our priority.  As part of our continuing mission to provide you with exceptional heart care, we have created designated Provider Care Teams.  These Care Teams include your primary Cardiologist (physician) and Advanced Practice Providers (APPs -  Physician Assistants and Nurse Practitioners) who all work together to provide you with the care you need, when you need it.  We recommend signing up for the patient portal called "MyChart".  Sign up information is provided on this After Visit Summary.  MyChart is used to connect with patients for Virtual Visits (Telemedicine).  Patients are able to view lab/test results, encounter notes, upcoming appointments, etc.  Non-urgent messages can be sent to your provider as well.   To learn more about what you can do with MyChart, go  to [email or web address].    Your next appointment:   2 month(s)  The format for your next appointment:   In Person  Provider:   Christen Bame, NP         Other Instructions  Potassium Content of Foods  Potassium is a mineral found in many foods and drinks. It can affect how the heart works, affect blood pressure, and keep fluids and electrolytes balanced in the body. It is important not to have too much potassium (hyperkalemia) or too little potassium (hypokalemia) in the body, especially in the blood. Potassium is naturally found in many different types of whole foods, such as fruits, vegetables, meat, and dairy products. Processed foods tend to be lower in potassium. The amount of potassium you need each day depends on your age and any medical conditions  you may have. General recommendations are: Females aged 57 and older: 2,600 mg per day. Males aged 32 and older: 3,400 mg per day. Talk with your health care provider or dietitian about how much potassium you need. What foods are high in potassium? Below are examples of foods that have greater than 200 mg of potassium per serving. Fruits Orange -- 1 medium (130 g) has 230 mg of potassium. Banana -- 1 medium (120 g) has 420 mg of potassium. Cantaloupe, chunks -- 1 cup (160 g) has 430 mg of potassium. Vegetables Potato, baked, without skin -- 1 medium (170 g) has 600 mg of potassium. Broccoli, chopped, cooked --  cup (77.5 g) has 230 mg of potassium. Tomato, chopped or sliced -- 1 cup (578 g) has 400 mg of potassium. Grains Cereal, bran with raisins -- 1 cup (59 g) has 360 mg of potassium. Granola with almonds --  cup (82 g) has 220 mg of potassium. Meats and other proteins Ground beef patty -- 4 ounces (113 g) has 240 mg of potassium. Kidney beans, boiled --  cup (130 g) has 350 mg of potassium. Almonds -- 1 ounce (approximately 22 nuts or 28 g) has 200 mg of potassium. Dairy Cow's milk, 1% -- 1 cup (237 mL) has 360 mg of potassium. Plain vanilla low-fat yogurt --  cup (184 g) has 220 mg of potassium. The items listed above may not be a complete list of foods high in potassium. Actual amounts of potassium may be different depending on ripeness, shelf life, and food preparation. Contact a dietitian for more information. What foods are low in potassium? Below are examples of foods that have less than 200 mg of potassium per serving. Fruits Blueberries -- 1 cup (145 g) has 110 mg of potassium. Apple -- 1 medium (140 g) has 145 mg of potassium. Grapes -- 1 cup (160 g) has 175 mg of potassium. Vegetables Cabbage, raw -- 1 cup (70 g) has 120 mg of potassium. Cauliflower, chopped, cooked -- 1 cup (180 g) has 90 mg of potassium. Romaine lettuce, chopped -- 1 cup (56 g) has 120 mg of  potassium. Grains Bagel, plain -- one 4-inch (10 cm) has 100 mg of potassium. Whole wheat bread -- 1 slice (26 g) has 70 mg of potassium. White rice, cooked -- 1 cup (163 g) has 50 mg of potassium. Meats and other proteins Tuna, light, canned in water -- 3 ounces (85 g) has 150 mg of potassium. Egg, fried -- 1 large (50 g) has 60 mg of potassium. Peanuts --1 ounce (35 nuts or 28 g) has 180 mg of potassium. Tofu --  cup (252 g) has 150  mg of potassium. Dairy Cheese (cheddar, colby, mozzarella, or provolone) -- 1 ounce (28 g) has 30 to 40 mg of potassium. The items listed above may not be a complete list of foods that are low in potassium. Actual amounts of potassium may be different depending on ripeness, shelf life, and food preparation. Contact a dietitian for more information. Summary Potassium is a mineral found in many foods and drinks. It affects how the heart works, affects blood pressure, and keeps fluids and electrolytes balanced in the body. The amount of potassium you need each day depends on your age and any existing medical conditions you may have. Your health care provider or dietitian may recommend an amount of potassium that you should have each day. This information is not intended to replace advice given to you by your health care provider. Make sure you discuss any questions you have with your health care provider. Document Revised: 03/08/2021 Document Reviewed: 02/17/2021 Elsevier Patient Education  [place]

## 2022-02-15 ENCOUNTER — Encounter: Payer: Self-pay | Admitting: *Deleted

## 2022-02-15 ENCOUNTER — Encounter: Payer: Self-pay | Admitting: Internal Medicine

## 2022-02-15 NOTE — Progress Notes (Signed)
Patient ID: Madeline Garza, female   DOB: March 28, 1956, 66 y.o.   MRN: [id number] Patient enrolled for [employer] to ship a 30 day cardiac event monitor to her address on file.

## 2022-02-16 ENCOUNTER — Other Ambulatory Visit: Payer: Self-pay | Admitting: Internal Medicine

## 2022-02-22 DIAGNOSIS — S92351D Displaced fracture of fifth metatarsal bone, right foot, subsequent encounter for fracture with routine healing: Secondary | ICD-10-CM | POA: Diagnosis not present

## 2022-02-22 DIAGNOSIS — R262 Difficulty in walking, not elsewhere classified: Secondary | ICD-10-CM | POA: Diagnosis not present

## 2022-02-22 DIAGNOSIS — M25671 Stiffness of right ankle, not elsewhere classified: Secondary | ICD-10-CM | POA: Diagnosis not present

## 2022-02-22 DIAGNOSIS — M6281 Muscle weakness (generalized): Secondary | ICD-10-CM | POA: Diagnosis not present

## 2022-02-24 ENCOUNTER — Ambulatory Visit (HOSPITAL_COMMUNITY): Payer: BLUE CROSS/BLUE SHIELD | Attending: Cardiovascular Disease

## 2022-02-24 DIAGNOSIS — R002 Palpitations: Secondary | ICD-10-CM | POA: Insufficient documentation

## 2022-02-24 DIAGNOSIS — I471 Supraventricular tachycardia: Secondary | ICD-10-CM

## 2022-02-24 DIAGNOSIS — R778 Other specified abnormalities of plasma proteins: Secondary | ICD-10-CM | POA: Insufficient documentation

## 2022-02-24 DIAGNOSIS — E785 Hyperlipidemia, unspecified: Secondary | ICD-10-CM | POA: Insufficient documentation

## 2022-02-24 LAB — ECHOCARDIOGRAM COMPLETE
Area-P 1/2: 4.15 cm2
S' Lateral: 2.7 cm

## 2022-02-26 ENCOUNTER — Ambulatory Visit: Payer: BLUE CROSS/BLUE SHIELD | Attending: Nurse Practitioner

## 2022-02-26 DIAGNOSIS — R778 Other specified abnormalities of plasma proteins: Secondary | ICD-10-CM | POA: Diagnosis not present

## 2022-02-26 DIAGNOSIS — R002 Palpitations: Secondary | ICD-10-CM | POA: Diagnosis not present

## 2022-02-26 DIAGNOSIS — E785 Hyperlipidemia, unspecified: Secondary | ICD-10-CM | POA: Diagnosis not present

## 2022-02-26 DIAGNOSIS — R7989 Other specified abnormal findings of blood chemistry: Secondary | ICD-10-CM

## 2022-02-26 DIAGNOSIS — I471 Supraventricular tachycardia, unspecified: Secondary | ICD-10-CM

## 2022-03-09 DIAGNOSIS — R7303 Prediabetes: Secondary | ICD-10-CM | POA: Diagnosis not present

## 2022-03-09 DIAGNOSIS — Z6830 Body mass index (BMI) 30.0-30.9, adult: Secondary | ICD-10-CM | POA: Diagnosis not present

## 2022-03-09 DIAGNOSIS — I10 Essential (primary) hypertension: Secondary | ICD-10-CM | POA: Diagnosis not present

## 2022-03-10 ENCOUNTER — Other Ambulatory Visit: Payer: Self-pay | Admitting: Internal Medicine

## 2022-03-13 DIAGNOSIS — S92351D Displaced fracture of fifth metatarsal bone, right foot, subsequent encounter for fracture with routine healing: Secondary | ICD-10-CM | POA: Diagnosis not present

## 2022-03-16 ENCOUNTER — Encounter: Payer: Self-pay | Admitting: Internal Medicine

## 2022-03-16 ENCOUNTER — Ambulatory Visit (INDEPENDENT_AMBULATORY_CARE_PROVIDER_SITE_OTHER): Payer: BLUE CROSS/BLUE SHIELD | Admitting: Internal Medicine

## 2022-03-16 VITALS — BP 100/64 | HR 62 | Ht 64.0 in | Wt 183.0 lb

## 2022-03-16 DIAGNOSIS — E038 Other specified hypothyroidism: Secondary | ICD-10-CM | POA: Diagnosis not present

## 2022-03-16 DIAGNOSIS — E063 Autoimmune thyroiditis: Secondary | ICD-10-CM | POA: Diagnosis not present

## 2022-03-16 LAB — T3, FREE: T3, Free: 4.6 pg/mL — ABNORMAL HIGH (ref 2.3–4.2)

## 2022-03-16 LAB — T4, FREE: Free T4: 0.7 ng/dL (ref 0.60–1.60)

## 2022-03-16 LAB — TSH: TSH: 0.76 u[IU]/mL (ref 0.35–5.50)

## 2022-03-16 MED ORDER — THYROID 60 MG PO TABS: 60.0000 mg | ORAL_TABLET | Freq: Every day | ORAL | 3 refills | Status: DC
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
## 2022-03-16 NOTE — Patient Instructions (Signed)
Please continue Armour 60 mg daily  Take the thyroid hormone every day, with water, at least 30 minutes before breakfast, separated by at least 4 hours from: - acid reflux medications - calcium - iron - multivitamins  Please stop at the lab.  Please return in 1 year.

## 2022-03-26 ENCOUNTER — Other Ambulatory Visit: Payer: Self-pay | Admitting: Internal Medicine

## 2022-03-27 ENCOUNTER — Other Ambulatory Visit: Payer: Self-pay | Admitting: *Deleted

## 2022-03-27 MED ORDER — METOPROLOL SUCCINATE ER 50 MG PO TB24: ORAL_TABLET | ORAL | 3 refills | Status: DC
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
## 2022-04-12 ENCOUNTER — Ambulatory Visit: Payer: BLUE CROSS/BLUE SHIELD | Attending: Nurse Practitioner | Admitting: Nurse Practitioner

## 2022-04-28 ENCOUNTER — Other Ambulatory Visit: Payer: Self-pay

## 2022-04-28 MED ORDER — LISINOPRIL 10 MG PO TABS: 5.0000 mg | ORAL_TABLET | Freq: Every day | ORAL | 2 refills | Status: DC

## 2022-06-13 ENCOUNTER — Other Ambulatory Visit (HOSPITAL_COMMUNITY): Payer: Self-pay

## 2022-06-13 MED ORDER — WEGOVY 1 MG/0.5ML ~~LOC~~ SOAJ
1.0000 mg | SUBCUTANEOUS | 0 refills | Status: DC
  Filled 2022-06-13 – 2022-07-12 (×2): qty 2, 28d supply, fill #0

## 2022-06-20 ENCOUNTER — Other Ambulatory Visit (HOSPITAL_COMMUNITY): Payer: Self-pay

## 2022-07-12 ENCOUNTER — Other Ambulatory Visit (HOSPITAL_BASED_OUTPATIENT_CLINIC_OR_DEPARTMENT_OTHER): Payer: Self-pay

## 2022-08-16 ENCOUNTER — Other Ambulatory Visit (HOSPITAL_COMMUNITY): Payer: Self-pay

## 2022-08-16 MED ORDER — WEGOVY 0.5 MG/0.5ML ~~LOC~~ SOAJ
0.5000 mg | SUBCUTANEOUS | 0 refills | Status: DC
  Filled 2022-08-16: qty 2, 28d supply, fill #0

## 2022-08-17 ENCOUNTER — Other Ambulatory Visit (HOSPITAL_COMMUNITY): Payer: Self-pay

## 2022-08-17 ENCOUNTER — Other Ambulatory Visit (HOSPITAL_BASED_OUTPATIENT_CLINIC_OR_DEPARTMENT_OTHER): Payer: Self-pay

## 2022-08-23 ENCOUNTER — Other Ambulatory Visit (HOSPITAL_COMMUNITY): Payer: Self-pay

## 2022-09-08 ENCOUNTER — Other Ambulatory Visit (HOSPITAL_COMMUNITY): Payer: Self-pay
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
## 2022-10-02 ENCOUNTER — Other Ambulatory Visit (HOSPITAL_COMMUNITY)
Admission: RE | Admit: 2022-10-02 | Discharge: 2022-10-02 | Disposition: A | Discharge status: Home / Self Care | Payer: 59 | Source: Ambulatory Visit | Attending: Obstetrics and Gynecology | Admitting: Obstetrics and Gynecology

## 2022-10-02 ENCOUNTER — Encounter: Payer: Self-pay | Admitting: Obstetrics and Gynecology

## 2022-10-02 ENCOUNTER — Ambulatory Visit (INDEPENDENT_AMBULATORY_CARE_PROVIDER_SITE_OTHER): Payer: 59 | Admitting: Obstetrics and Gynecology

## 2022-10-02 VITALS — BP 126/84 | HR 74 | Ht 64.0 in | Wt 188.0 lb

## 2022-10-02 DIAGNOSIS — Z124 Encounter for screening for malignant neoplasm of cervix: Secondary | ICD-10-CM

## 2022-10-02 DIAGNOSIS — Z01419 Encounter for gynecological examination (general) (routine) without abnormal findings: Secondary | ICD-10-CM | POA: Diagnosis not present

## 2022-10-02 DIAGNOSIS — M8589 Other specified disorders of bone density and structure, multiple sites: Secondary | ICD-10-CM

## 2022-10-02 DIAGNOSIS — Z9189 Other specified personal risk factors, not elsewhere classified: Secondary | ICD-10-CM

## 2022-10-02 DIAGNOSIS — R34 Anuria and oliguria: Secondary | ICD-10-CM | POA: Diagnosis not present

## 2022-10-02 NOTE — Patient Instructions (Signed)

## 2022-10-04 LAB — CYTOLOGY - PAP
Adequacy: ABSENT
Comment: NEGATIVE
Diagnosis: NEGATIVE
High risk HPV: NEGATIVE

## 2022-10-04 LAB — URINALYSIS, COMPLETE W/RFL CULTURE
Bilirubin Urine: NEGATIVE
Glucose, UA: NEGATIVE
Hgb urine dipstick: NEGATIVE
Hyaline Cast: NONE SEEN /LPF
Ketones, ur: NEGATIVE
Nitrites, Initial: NEGATIVE
Protein, ur: NEGATIVE
RBC / HPF: NONE SEEN /HPF (ref 0–2)
Specific Gravity, Urine: 1.006 (ref 1.001–1.035)
pH: 5.5 (ref 5.0–8.0)

## 2022-10-04 LAB — CULTURE INDICATED

## 2022-10-04 LAB — URINE CULTURE
MICRO NUMBER:: 14824911
Result:: NO GROWTH
SPECIMEN QUALITY:: ADEQUATE

## 2022-10-13 ENCOUNTER — Encounter: Payer: Self-pay | Admitting: Obstetrics and Gynecology

## 2022-10-13 ENCOUNTER — Encounter (HOSPITAL_BASED_OUTPATIENT_CLINIC_OR_DEPARTMENT_OTHER): Payer: Self-pay | Admitting: Cardiology

## 2022-10-13 MED ORDER — METOPROLOL TARTRATE 25 MG PO TABS: ORAL_TABLET | ORAL | 0 refills | Status: DC

## 2022-10-23 ENCOUNTER — Other Ambulatory Visit: Payer: Self-pay

## 2022-10-23 DIAGNOSIS — E7800 Pure hypercholesterolemia, unspecified: Secondary | ICD-10-CM

## 2022-10-23 MED ORDER — ATORVASTATIN CALCIUM 20 MG PO TABS: 20.0000 mg | ORAL_TABLET | Freq: Every day | ORAL | 0 refills | Status: DC

## 2022-10-25 ENCOUNTER — Ambulatory Visit (HOSPITAL_BASED_OUTPATIENT_CLINIC_OR_DEPARTMENT_OTHER): Payer: 59 | Admitting: Cardiology

## 2022-11-17 DIAGNOSIS — Z1231 Encounter for screening mammogram for malignant neoplasm of breast: Secondary | ICD-10-CM | POA: Diagnosis not present

## 2022-11-20 ENCOUNTER — Encounter: Payer: Self-pay | Admitting: Obstetrics and Gynecology

## 2022-11-21 ENCOUNTER — Encounter: Payer: Self-pay | Admitting: Physician Assistant

## 2022-11-21 ENCOUNTER — Ambulatory Visit: Payer: Medicare Other | Attending: Physician Assistant | Admitting: Physician Assistant

## 2022-11-21 VITALS — BP 114/68 | HR 71 | Ht 64.0 in | Wt 190.2 lb

## 2022-11-21 DIAGNOSIS — R002 Palpitations: Secondary | ICD-10-CM

## 2022-11-21 DIAGNOSIS — I25119 Atherosclerotic heart disease of native coronary artery with unspecified angina pectoris: Secondary | ICD-10-CM

## 2022-11-21 DIAGNOSIS — E782 Mixed hyperlipidemia: Secondary | ICD-10-CM

## 2022-11-21 MED ORDER — ATORVASTATIN CALCIUM 40 MG PO TABS: 40.0000 mg | ORAL_TABLET | Freq: Every day | ORAL | 3 refills | Status: AC

## 2022-11-21 MED ORDER — ASPIRIN 81 MG PO TBEC: 81.0000 mg | DELAYED_RELEASE_TABLET | Freq: Every day | ORAL | 12 refills | Status: DC

## 2022-11-21 MED ORDER — METOPROLOL SUCCINATE ER 50 MG PO TB24: 50.0000 mg | ORAL_TABLET | Freq: Every day | ORAL | 3 refills | Status: DC

## 2022-11-21 NOTE — Assessment & Plan Note (Addendum)
This is her second episode since August.  She went to the emergency room in August.  Her symptoms continue to sound consistent with SVT.  We discussed vagal maneuvers to break an SVT episode.  We also discussed continue to take metoprolol tartrate as needed for recurrent palpitations.  I have recommended increasing her daily dose of metoprolol succinate.  She has not had her TSH checked since January.  It is reasonable to recheck this as well.  I have recommended she look into either updating her Apple Watch or getting a Kardia Mobile device to evaluate her rhythm when this recurs.  It seems that she has symptoms about every 6 months or so.  A repeat monitor would not be helpful. TSH today Increase Toprol-XL to 50 mg daily Continue metoprolol tartrate 12.5 mg as needed for palpitations Update Apple Watch versus obtain Kardia mobile Vagal maneuvers Avoid stimulants Follow-up 6 months

## 2022-11-21 NOTE — Patient Instructions (Addendum)
Medication Instructions:  Your physician has recommended you make the following change in your medication:   INCREASE the Atorvastatin to 40 mg taking 1 daily.    INCREASE the Toprol to 50 mg taking 1 daily  *If you need a refill on your cardiac medications before your next appointment, please call your pharmacy*   Lab Work: TODAY:  TSH  02/23/23 (3 MONTHS): COME TO THE OFFICE ANYTIME AFTER 7:15 A.M, FASTING FOR LIPID & CMET  If you have labs (blood work) drawn today and your tests are completely normal, you will receive your results only by: MyChart Message (if you have MyChart) OR A paper copy in the mail If you have any lab test that is abnormal or we need to change your treatment, we will call you to review the results.   Testing/Procedures: None ordered   Follow-Up: At [facility], you and your health needs are our priority.  As part of our continuing mission to provide you with exceptional heart care, we have created designated Provider Care Teams.  These Care Teams include your primary Cardiologist (physician) and Advanced Practice Providers (APPs -  Physician Assistants and Nurse Practitioners) who all work together to provide you with the care you need, when you need it.  We recommend signing up for the patient portal called "MyChart".  Sign up information is provided on this After Visit Summary.  MyChart is used to connect with patients for Virtual Visits (Telemedicine).  Patients are able to view lab/test results, encounter notes, upcoming appointments, etc.  Non-urgent messages can be sent to your provider as well.   To learn more about what you can do with MyChart, go to [email or web address].    Your next appointment:   6 month(s)  Provider:   Donato Schultz, MD     Other Instructions Look into the Kardia mobile to ID the heart rhythm with the palpitations

## 2022-11-21 NOTE — Assessment & Plan Note (Signed)
As noted, LDL above goal.  She cannot afford PCSK9 inhibitors.  She does have a history of myalgias at higher dose statins.  She is willing to try atorvastatin 40 mg daily.  If she can tolerate this, repeat CMET, lipids in 3 months.  If she cannot tolerate this, we can add ezetimibe 10 mg daily to atorvastatin 20 mg daily.

## 2022-11-21 NOTE — Assessment & Plan Note (Signed)
Nonobstructive disease by coronary CTA in October 2022.  She is not having symptoms of angina.  Labs reviewed via KPN.  Her LDL was above goal in January 2024 at 80.  Goal is <70, ideally <55.  Increase atorvastatin to 40 mg daily.  She is not currently taking an aspirin.  She does not have an allergy.  Start ASA 81 mg daily.  Follow-up 6 months.

## 2022-11-21 NOTE — Progress Notes (Addendum)
Cardiology Office Note:    Date:  11/21/2022  ID:  Madeline Garza, DOB [birth year], MRN [id number] PCP: Shon Hale, MD  [facility] Providers Cardiologist:  Donato Schultz, MD       Patient Profile:      Coronary artery disease CAC score 08/24/2020: 154 (90th percentile) CCTA 04/03/2021: CAC score 231 (92nd percentile); mild mixed plaque proximal LAD <50, possible severe D1 (70-99), proximal LCx <50, small caliber co-dom RCA; D1 with borderline poststenotic FFR 0.8; aortic atherosclerosis TTE 02/24/2022: EF 60-65, no RWMA, normal RVSF, normal PASP, mild LAE, mild MR, RAP 3  FHx CAD Palpitations (HRs ? to 170s - ?SVT) ED visit 01/2022: EKG w/o arrhythmia; hsTrops ? (111-129) felt to be 2/2 demand ischemia Monitor 03/2022: NSR, no A-fib; rare PVCs, PACs Hyperlipidemia  Intol of statins (higher dose) PCSK9 inhib too expensive       History of Present Illness:   Madeline Garza is a 67 y.o. female who returns for follow-up of CAD, palpitations.  She was last seen by Dr. Anne Fu 10/21/2021.  She did see Eligha Bridegroom, NP in 01/2022 for palpitations.  She had documented heart rates in the 170s on her Apple Watch.  She was in sinus rhythm in the emergency room.  Troponins were elevated but in a flat pattern consistent with demand ischemia.  It was felt that she probably has SVT.  Follow-up monitor demonstrated no arrhythmias or atrial fibrillation.  Echo demonstrated normal LV function.  She is here alone.  She notes another episode of rapid palpitations in April.  This awoke her from sleep.  She did not go to the emergency room.  She did not have associated chest pain.  She has not had chest discomfort at other times.  She has not had shortness of breath, syncope, orthopnea, leg edema.  Review of Systems  Constitutional: Positive for weight gain. Negative for chills.  Respiratory:  Negative for cough.   Gastrointestinal:  Negative for hematochezia and melena.  Genitourinary:  Negative  for hematuria.  See the HPI    Studies Reviewed:    EKG: Not done   Risk Assessment/Calculations:             Physical Exam:   VS:  BP 114/68   Pulse 71   Ht 5\' 4"  (1.626 m)   Wt 190 lb 3.2 oz (86.3 kg)   LMP 06/19/2005 (Within Months)   SpO2 98%   BMI 32.65 kg/m    Wt Readings from Last 3 Encounters:  11/21/22 190 lb 3.2 oz (86.3 kg)  10/02/22 188 lb (85.3 kg)  03/16/22 183 lb (83 kg)    Constitutional:      Appearance: Healthy appearance. Not in distress.  Neck:     Vascular: JVD normal.  Pulmonary:     Breath sounds: Normal breath sounds. No wheezing. No rales.  Cardiovascular:     Normal rate. Regular rhythm.     Murmurs: There is no murmur.  Edema:    Peripheral edema absent.  Abdominal:     Palpations: Abdomen is soft.       ASSESSMENT AND PLAN:   Palpitations This is her second episode since August.  She went to the emergency room in August.  Her symptoms continue to sound consistent with SVT.  We discussed vagal maneuvers to break an SVT episode.  We also discussed continue to take metoprolol tartrate as needed for recurrent palpitations.  I have recommended increasing her daily dose of metoprolol  succinate.  She has not had her TSH checked since January.  It is reasonable to recheck this as well.  I have recommended she look into either updating her Apple Watch or getting a Kardia Mobile device to evaluate her rhythm when this recurs.  It seems that she has symptoms about every 6 months or so.  A repeat monitor would not be helpful. TSH today Increase Toprol-XL to 50 mg daily Continue metoprolol tartrate 12.5 mg as needed for palpitations Update Apple Watch versus obtain Kardia mobile Vagal maneuvers Avoid stimulants Follow-up 6 months  Coronary artery disease involving native coronary artery of native heart with angina pectoris (HCC) Nonobstructive disease by coronary CTA in October 2022.  She is not having symptoms of angina.  Labs reviewed via [facility].   Her LDL was above goal in January 2024 at 80.  Goal is <70, ideally <55.  Increase atorvastatin to 40 mg daily.  She is not currently taking an aspirin.  She does not have an allergy.  Start ASA 81 mg daily.  Follow-up 6 months.  Mixed hyperlipidemia As noted, LDL above goal.  She cannot afford PCSK9 inhibitors.  She does have a history of myalgias at higher dose statins.  She is willing to try atorvastatin 40 mg daily.  If she can tolerate this, repeat CMET, lipids in 3 months.  If she cannot tolerate this, we can add ezetimibe 10 mg daily to atorvastatin 20 mg daily.     Dispo:  Return in about 6 months (around 05/23/2023) for Routine Follow Up w/ Dr. Anne Fu.  Signed, Tereso Newcomer, PA-C

## 2022-11-23 LAB — TSH: TSH: 1.19 u[IU]/mL (ref 0.450–4.500)

## 2023-01-02 DIAGNOSIS — H524 Presbyopia: Secondary | ICD-10-CM | POA: Diagnosis not present

## 2023-01-02 DIAGNOSIS — H33301 Unspecified retinal break, right eye: Secondary | ICD-10-CM | POA: Diagnosis not present

## 2023-01-02 DIAGNOSIS — H5211 Myopia, right eye: Secondary | ICD-10-CM | POA: Diagnosis not present

## 2023-01-02 DIAGNOSIS — H53143 Visual discomfort, bilateral: Secondary | ICD-10-CM | POA: Diagnosis not present

## 2023-01-02 DIAGNOSIS — H52223 Regular astigmatism, bilateral: Secondary | ICD-10-CM | POA: Diagnosis not present

## 2023-01-31 ENCOUNTER — Other Ambulatory Visit: Payer: Self-pay | Admitting: Cardiology

## 2023-01-31 DIAGNOSIS — E7800 Pure hypercholesterolemia, unspecified: Secondary | ICD-10-CM

## 2023-02-05 ENCOUNTER — Other Ambulatory Visit: Payer: Self-pay | Admitting: *Deleted

## 2023-02-05 MED ORDER — LISINOPRIL 5 MG PO TABS: 5.0000 mg | ORAL_TABLET | Freq: Every day | ORAL | 2 refills | Status: DC

## 2023-02-07 ENCOUNTER — Other Ambulatory Visit: Payer: Self-pay | Admitting: Cardiology

## 2023-02-07 DIAGNOSIS — E7800 Pure hypercholesterolemia, unspecified: Secondary | ICD-10-CM

## 2023-02-23 ENCOUNTER — Ambulatory Visit: Payer: Medicare Other | Attending: Family Medicine

## 2023-02-23 DIAGNOSIS — I25119 Atherosclerotic heart disease of native coronary artery with unspecified angina pectoris: Secondary | ICD-10-CM | POA: Diagnosis not present

## 2023-02-23 DIAGNOSIS — R002 Palpitations: Secondary | ICD-10-CM

## 2023-02-23 LAB — COMPREHENSIVE METABOLIC PANEL
ALT: 12 IU/L (ref 0–32)
AST: 13 IU/L (ref 0–40)
Albumin: 4.2 g/dL (ref 3.9–4.9)
Alkaline Phosphatase: 89 IU/L (ref 44–121)
BUN/Creatinine Ratio: 13 (ref 12–28)
BUN: 9 mg/dL (ref 8–27)
Bilirubin Total: 0.4 mg/dL (ref 0.0–1.2)
CO2: 25 mmol/L (ref 20–29)
Calcium: 9.5 mg/dL (ref 8.7–10.3)
Chloride: 103 mmol/L (ref 96–106)
Creatinine, Ser: 0.68 mg/dL (ref 0.57–1.00)
Globulin, Total: 2.2 g/dL (ref 1.5–4.5)
Glucose: 89 mg/dL (ref 70–99)
Potassium: 4.5 mmol/L (ref 3.5–5.2)
Sodium: 138 mmol/L (ref 134–144)
Total Protein: 6.4 g/dL (ref 6.0–8.5)
eGFR: 97 mL/min/{1.73_m2} (ref >59)

## 2023-02-23 LAB — LIPID PANEL
Chol/HDL Ratio: 3.7 ratio (ref 0.0–4.4)
Cholesterol, Total: 199 mg/dL (ref 100–199)
HDL: 54 mg/dL (ref >39)
LDL Chol Calc (NIH): 132 mg/dL — ABNORMAL HIGH (ref 0–99)
Triglycerides: 72 mg/dL (ref 0–149)
VLDL Cholesterol Cal: 13 mg/dL (ref 5–40)

## 2023-02-26 DIAGNOSIS — J029 Acute pharyngitis, unspecified: Secondary | ICD-10-CM | POA: Diagnosis not present

## 2023-02-26 DIAGNOSIS — Z20822 Contact with and (suspected) exposure to covid-19: Secondary | ICD-10-CM | POA: Diagnosis not present

## 2023-02-26 DIAGNOSIS — R059 Cough, unspecified: Secondary | ICD-10-CM | POA: Diagnosis not present

## 2023-02-27 ENCOUNTER — Telehealth: Payer: Self-pay | Admitting: *Deleted

## 2023-02-27 DIAGNOSIS — E782 Mixed hyperlipidemia: Secondary | ICD-10-CM

## 2023-02-27 NOTE — Telephone Encounter (Signed)
-----   Message from Tereso Newcomer sent at 02/27/2023 10:58 AM EDT ----- [place]. Restart Atorvastatin. Obtain fasting Lipids, ALT at follow up with Dr. Anne Fu in Dec 2024. Tereso Newcomer, PA-C    02/27/2023 10:58 AM

## 2023-03-20 ENCOUNTER — Encounter: Payer: Self-pay | Admitting: Internal Medicine

## 2023-03-20 ENCOUNTER — Ambulatory Visit: Payer: Medicare Other | Admitting: Internal Medicine

## 2023-03-20 VITALS — BP 136/70 | HR 59 | Resp 18 | Ht 64.0 in | Wt 177.2 lb

## 2023-03-20 DIAGNOSIS — E063 Autoimmune thyroiditis: Secondary | ICD-10-CM | POA: Diagnosis not present

## 2023-03-20 LAB — T4, FREE: Free T4: 0.74 ng/dL (ref 0.60–1.60)

## 2023-03-20 LAB — TSH: TSH: 1.5 u[IU]/mL (ref 0.35–5.50)

## 2023-03-20 LAB — T3, FREE: T3, Free: 4.8 pg/mL — ABNORMAL HIGH (ref 2.3–4.2)

## 2023-03-20 MED ORDER — THYROID 60 MG PO TABS: 60.0000 mg | ORAL_TABLET | Freq: Every day | ORAL | 3 refills | Status: DC

## 2023-03-20 NOTE — Patient Instructions (Signed)
Please continue Armour 60 mg daily  Take the thyroid hormone every day, with water, at least 30 minutes before breakfast, separated by at least 4 hours from: - acid reflux medications - calcium - iron - multivitamins  Please stop at the lab.  Please return in 1 year.
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
## 2023-03-22 DIAGNOSIS — R051 Acute cough: Secondary | ICD-10-CM | POA: Diagnosis not present

## 2023-05-24 ENCOUNTER — Other Ambulatory Visit: Payer: Self-pay | Admitting: Cardiology

## 2023-05-24 ENCOUNTER — Ambulatory Visit: Payer: Medicare Other

## 2023-05-24 ENCOUNTER — Ambulatory Visit: Payer: Medicare Other | Attending: Cardiology | Admitting: Cardiology

## 2023-05-24 ENCOUNTER — Encounter: Payer: Self-pay | Admitting: Cardiology

## 2023-05-24 VITALS — BP 128/72 | HR 64 | Ht 64.0 in | Wt 175.0 lb

## 2023-05-24 DIAGNOSIS — I471 Supraventricular tachycardia, unspecified: Secondary | ICD-10-CM

## 2023-05-24 DIAGNOSIS — I25119 Atherosclerotic heart disease of native coronary artery with unspecified angina pectoris: Secondary | ICD-10-CM

## 2023-05-24 DIAGNOSIS — E782 Mixed hyperlipidemia: Secondary | ICD-10-CM

## 2023-05-24 NOTE — Progress Notes (Signed)
Cardiology Office Note:  .   Date:  05/24/2023  ID:  Madeline Garza, DOB 12/22/1955, MRN [id number] PCP: Shon Hale, MD   [facility] Providers Cardiologist:  Donato Schultz, MD    History of Present Illness: .   Madeline Garza is a 67 y.o. female Discussed with the use of AI scribe   History of Present Illness   The patient, a 67 year old individual with nonobstructive coronary artery disease identified on coronary CT in 2022, presents for follow-up. She is currently on atorvastatin 40mg  daily, but reports inconsistent adherence due to perceived cognitive side effects, described as 'brain fog.' She also reports previous intolerance to another statin due to musculoskeletal discomfort. The patient has recently experienced palpitations associated with supraventricular tachycardia (SVT). Her TSH is reported as normal at 1.5, and she is on Toprol 50mg .  The patient has been adhering to a Mediterranean diet and has lost significant weight, from 190lbs to 175lbs. She reports consuming four ounces of protein, vegetables, and fruit twice daily, and drinking a gallon of water daily. She has set a personal goal to reach 150lbs. Despite these lifestyle changes, her LDL remains elevated at 132, and she is unable to afford PCSK9 inhibitors.           Studies Reviewed: Marland Kitchen   EKG Interpretation Date/Time:  Thursday May 24 2023 14:48:20 EST Ventricular Rate:  64 PR Interval:  140 QRS Duration:  80 QT Interval:  408 QTC Calculation: 420 R Axis:   32  Text Interpretation: Normal sinus rhythm Normal ECG When compared with ECG of 10-Feb-2022 20:51, No significant change was found Confirmed by Donato Schultz ([id number]) on 05/24/2023 2:54:24 PM    Results   LABS LDL: 132 TSH: 1.5  RADIOLOGY Coronary CT: nonobstructive coronary artery disease (2022)     Risk Assessment/Calculations:            Physical Exam:   VS:  BP 128/72   Pulse 64   Ht 5\' 4"  (1.626 m)   Wt 175 lb (79.4  kg)   LMP 06/19/2005 (Within Months)   SpO2 98%   BMI 30.04 kg/m    Wt Readings from Last 3 Encounters:  05/24/23 175 lb (79.4 kg)  03/20/23 177 lb 3.2 oz (80.4 kg)  11/21/22 190 lb 3.2 oz (86.3 kg)    GEN: Well nourished, well developed in no acute distress NECK: No JVD; No carotid bruits CARDIAC: RRR, no murmurs, no rubs, no gallops RESPIRATORY:  Clear to auscultation without rales, wheezing or rhonchi  ABDOMEN: Soft, non-tender, non-distended EXTREMITIES:  No edema; No deformity   ASSESSMENT AND PLAN: .    Assessment and Plan    Nonobstructive Coronary Artery Disease Follow-up for nonobstructive coronary artery disease diagnosed via coronary CT in 2022. Currently managed with atorvastatin 40 mg daily. Reports intermittent adherence due to perceived side effects (brain fog). LDL is 132 mg/dL. Discussed the importance of cholesterol management and potential alternative medications, including Zetia 10 mg as a non-statin option to reduce cholesterol without muscle pain. - Order lipid panel - Consider switching to Zetia 10 mg if lipid panel results indicate need for further cholesterol management - Continue Mediterranean diet and exercise regimen  Supraventricular Tachycardia (SVT) Recent episode of palpitations with SVT. Currently on Toprol 50 mg daily. TSH is normal at 1.5. Discussed the importance of monitoring for further episodes and potential medication adjustment if episodes persist. - Continue Toprol 50 mg daily - Monitor for further episodes of palpitations or SVT  General Health Maintenance Following a Mediterranean diet and exercise regimen. Discussed the importance of diet and exercise in managing overall health and cardiovascular risk. Encouraged to continue current regimen. - Encourage continued adherence to Mediterranean diet - Encourage regular exercise (30 minutes daily)  Follow-up - Call with lipid panel results - Discuss potential medication changes based on  results               Signed, Donato Schultz, MD

## 2023-05-24 NOTE — Patient Instructions (Signed)
Medication Instructions:  Your physician recommends that you continue on your current medications as directed. Please refer to the Current Medication list given to you today.  *If you need a refill on your cardiac medications before your next appointment, please call your pharmacy*  Lab Work: TODAY: Lipid panel If you have labs (blood work) drawn today and your tests are completely normal, you will receive your results only by: MyChart Message (if you have MyChart) OR A paper copy in the mail If you have any lab test that is abnormal or we need to change your treatment, we will call you to review the results.  Testing/Procedures: None ordered today.  Follow-Up: At [facility], you and your health needs are our priority.  As part of our continuing mission to provide you with exceptional heart care, we have created designated Provider Care Teams.  These Care Teams include your primary Cardiologist (physician) and Advanced Practice Providers (APPs -  Physician Assistants and Nurse Practitioners) who all work together to provide you with the care you need, when you need it.  Your next appointment:   1 year(s)  The format for your next appointment:   In Person  Provider:   Donato Schultz, MD

## 2023-05-25 LAB — LIPID PANEL
Chol/HDL Ratio: 3.4 {ratio} (ref 0.0–4.4)
Cholesterol, Total: 232 mg/dL — ABNORMAL HIGH (ref 100–199)
HDL: 68 mg/dL (ref >39)
LDL Chol Calc (NIH): 149 mg/dL — ABNORMAL HIGH (ref 0–99)
Triglycerides: 86 mg/dL (ref 0–149)
VLDL Cholesterol Cal: 15 mg/dL (ref 5–40)

## 2023-05-30 ENCOUNTER — Encounter: Payer: Self-pay | Admitting: Cardiology

## 2023-05-30 DIAGNOSIS — Z79899 Other long term (current) drug therapy: Secondary | ICD-10-CM

## 2023-05-30 DIAGNOSIS — E782 Mixed hyperlipidemia: Secondary | ICD-10-CM

## 2023-05-30 MED ORDER — EZETIMIBE 10 MG PO TABS: 10.0000 mg | ORAL_TABLET | Freq: Every day | ORAL | 3 refills | Status: DC

## 2023-05-31 ENCOUNTER — Other Ambulatory Visit: Payer: Self-pay | Admitting: *Deleted

## 2023-05-31 DIAGNOSIS — Z79899 Other long term (current) drug therapy: Secondary | ICD-10-CM

## 2023-05-31 DIAGNOSIS — E782 Mixed hyperlipidemia: Secondary | ICD-10-CM

## 2023-06-15 DIAGNOSIS — M25561 Pain in right knee: Secondary | ICD-10-CM | POA: Diagnosis not present

## 2023-06-29 ENCOUNTER — Other Ambulatory Visit (HOSPITAL_BASED_OUTPATIENT_CLINIC_OR_DEPARTMENT_OTHER): Payer: Self-pay

## 2023-07-24 DIAGNOSIS — I2510 Atherosclerotic heart disease of native coronary artery without angina pectoris: Secondary | ICD-10-CM | POA: Diagnosis not present

## 2023-07-24 DIAGNOSIS — I10 Essential (primary) hypertension: Secondary | ICD-10-CM | POA: Diagnosis not present

## 2023-07-24 DIAGNOSIS — E7800 Pure hypercholesterolemia, unspecified: Secondary | ICD-10-CM | POA: Diagnosis not present

## 2023-07-24 DIAGNOSIS — Z0000 Encounter for general adult medical examination without abnormal findings: Secondary | ICD-10-CM | POA: Diagnosis not present

## 2023-07-24 DIAGNOSIS — L820 Inflamed seborrheic keratosis: Secondary | ICD-10-CM | POA: Diagnosis not present

## 2023-07-24 DIAGNOSIS — R7303 Prediabetes: Secondary | ICD-10-CM | POA: Diagnosis not present

## 2023-07-24 DIAGNOSIS — Z23 Encounter for immunization: Secondary | ICD-10-CM | POA: Diagnosis not present

## 2023-08-02 ENCOUNTER — Encounter: Payer: Self-pay | Admitting: Cardiology

## 2023-08-06 ENCOUNTER — Telehealth: Payer: Self-pay | Admitting: Pharmacy Technician

## 2023-08-06 ENCOUNTER — Other Ambulatory Visit (HOSPITAL_COMMUNITY): Payer: Self-pay

## 2023-08-06 NOTE — Telephone Encounter (Signed)
 Pharmacy Patient Advocate Encounter  Received notification from [facility] that Prior Authorization for Repatha has been APPROVED from 08/06/23 to 02/03/24. Ran test claim, Copay is $467.00- one month (DEDUCTIBLE). This test claim was processed through [facility]- copay amounts may vary at other pharmacies due to pharmacy/plan contracts, or as the patient moves through the different stages of their insurance plan.   PA #/Case ID/Reference #: [id number]

## 2023-08-06 NOTE — Telephone Encounter (Signed)
 Pharmacy Patient Advocate Encounter   Received notification from Patient Advice Request messages that prior authorization for Repatha is required/requested.   Insurance verification completed.   The patient is insured through [employer] .   Per test claim: PA required; PA submitted to above mentioned insurance via CoverMyMeds Key/confirmation #/EOC [id number] Status is pending

## 2023-08-09 ENCOUNTER — Ambulatory Visit: Payer: Medicare Other

## 2023-08-13 ENCOUNTER — Encounter: Payer: Self-pay | Admitting: Pharmacist

## 2023-08-13 ENCOUNTER — Ambulatory Visit: Payer: Medicare Other | Attending: Internal Medicine | Admitting: Pharmacist

## 2023-08-13 DIAGNOSIS — E782 Mixed hyperlipidemia: Secondary | ICD-10-CM

## 2023-08-13 NOTE — Patient Instructions (Signed)
 Your Results:             Your most recent labs Goal  Total Cholesterol 232 < 200  Triglycerides 86 < 150  HDL (happy/good cholesterol) 68 > 40  LDL (lousy/bad cholesterol 149 < 70   Medication changes: Restart atorvastatin 40 mg daily if not tolerated take 20 mg daily or every other day. Continue taking ezetimibe 10 mg daily
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
## 2023-08-13 NOTE — Assessment & Plan Note (Signed)
 Assessment:  LDL goal: < 70 mg/dl last LDLc 161  mg/dl (02/6044) a year ago LDL was 74 while on Atorvastatin 40 mg  Current medication -ezetimibe 10 mg daily  Intolerance to statins - atorvastatin - brian gog, dizziness, rosuvastatin - myalgia  Repatha cost prohibitive - would not qualify for [employer] healthy diet and exercise regularly  Patient is in agreement to retry atorvastatin   Plan: Continue taking current medications (Zetia 10 mg daily) Start taking atorvastatin 40 gm daily if not tolerated take 20 mg daily or every other day  Will follow up in 1 month on tolerability  If unable to tolerate or LDLc not at goal on max tolerated therapy - will initiate Repatha ( PA approved)  Lipid lab due in 2-3 months after being on max tolerated lipid lowering therapy

## 2023-09-05 ENCOUNTER — Telehealth: Payer: Self-pay | Admitting: Pharmacist

## 2023-09-05 DIAGNOSIS — E782 Mixed hyperlipidemia: Secondary | ICD-10-CM

## 2023-09-05 NOTE — Telephone Encounter (Signed)
 Spoke to patient tolerates 20 mg Lipitor and wanted to continue with that and Zetia get lab done in May 2025 if needed she is willing to go on PCSK9i.

## 2023-10-26 DIAGNOSIS — M25561 Pain in right knee: Secondary | ICD-10-CM | POA: Diagnosis not present

## 2023-11-02 DIAGNOSIS — M25561 Pain in right knee: Secondary | ICD-10-CM | POA: Diagnosis not present

## 2023-11-08 NOTE — Addendum Note (Signed)
 Addended by: Thayer Embleton K on: 11/08/2023 04:03 PM   Modules accepted: Orders

## 2023-11-09 ENCOUNTER — Encounter: Payer: Self-pay | Admitting: Pharmacist

## 2023-11-13 NOTE — Telephone Encounter (Signed)
 Erroneous encounter

## 2023-11-16 DIAGNOSIS — M1711 Unilateral primary osteoarthritis, right knee: Secondary | ICD-10-CM | POA: Diagnosis not present

## 2023-11-20 ENCOUNTER — Ambulatory Visit (INDEPENDENT_AMBULATORY_CARE_PROVIDER_SITE_OTHER): Payer: Medicare Other | Admitting: Obstetrics and Gynecology

## 2023-11-20 ENCOUNTER — Encounter: Payer: Self-pay | Admitting: Obstetrics and Gynecology

## 2023-11-20 VITALS — BP 132/84 | HR 83 | Ht 64.0 in | Wt 178.0 lb

## 2023-11-20 DIAGNOSIS — Z780 Asymptomatic menopausal state: Secondary | ICD-10-CM

## 2023-11-20 DIAGNOSIS — M8580 Other specified disorders of bone density and structure, unspecified site: Secondary | ICD-10-CM | POA: Diagnosis not present

## 2023-11-20 DIAGNOSIS — Z01419 Encounter for gynecological examination (general) (routine) without abnormal findings: Secondary | ICD-10-CM | POA: Diagnosis not present

## 2023-11-20 DIAGNOSIS — Z1211 Encounter for screening for malignant neoplasm of colon: Secondary | ICD-10-CM

## 2023-11-20 DIAGNOSIS — M1711 Unilateral primary osteoarthritis, right knee: Secondary | ICD-10-CM | POA: Diagnosis not present

## 2023-11-20 DIAGNOSIS — M6281 Muscle weakness (generalized): Secondary | ICD-10-CM | POA: Diagnosis not present
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
## 2023-11-20 NOTE — Patient Instructions (Signed)

## 2023-11-23 DIAGNOSIS — M8588 Other specified disorders of bone density and structure, other site: Secondary | ICD-10-CM | POA: Diagnosis not present

## 2023-11-23 DIAGNOSIS — E782 Mixed hyperlipidemia: Secondary | ICD-10-CM | POA: Diagnosis not present

## 2023-11-23 DIAGNOSIS — Z1231 Encounter for screening mammogram for malignant neoplasm of breast: Secondary | ICD-10-CM | POA: Diagnosis not present

## 2023-11-23 DIAGNOSIS — Z780 Asymptomatic menopausal state: Secondary | ICD-10-CM | POA: Diagnosis not present

## 2023-11-23 LAB — HM DEXA SCAN

## 2023-11-23 LAB — HM MAMMOGRAPHY

## 2023-11-24 LAB — LIPID PANEL
Chol/HDL Ratio: 2.4 ratio (ref 0.0–4.4)
Cholesterol, Total: 127 mg/dL (ref 100–199)
HDL: 54 mg/dL (ref >39)
LDL Chol Calc (NIH): 55 mg/dL (ref 0–99)
Triglycerides: 96 mg/dL (ref 0–149)
VLDL Cholesterol Cal: 18 mg/dL (ref 5–40)

## 2023-11-24 LAB — HEPATIC FUNCTION PANEL
ALT: 19 IU/L (ref 0–32)
AST: 16 IU/L (ref 0–40)
Albumin: 4.3 g/dL (ref 3.9–4.9)
Alkaline Phosphatase: 100 IU/L (ref 44–121)
Bilirubin Total: 0.5 mg/dL (ref 0.0–1.2)
Bilirubin, Direct: 0.2 mg/dL (ref 0.00–0.40)
Total Protein: 6.3 g/dL (ref 6.0–8.5)

## 2023-11-26 ENCOUNTER — Ambulatory Visit: Payer: Self-pay | Admitting: Pharmacist

## 2023-11-28 ENCOUNTER — Ambulatory Visit: Payer: Self-pay | Admitting: Obstetrics and Gynecology

## 2023-11-28 ENCOUNTER — Encounter: Payer: Self-pay | Admitting: Obstetrics and Gynecology

## 2023-12-10 ENCOUNTER — Encounter: Payer: Self-pay | Admitting: Gastroenterology

## 2023-12-13 ENCOUNTER — Encounter: Payer: Self-pay | Admitting: Gastroenterology

## 2023-12-13 DIAGNOSIS — M6281 Muscle weakness (generalized): Secondary | ICD-10-CM | POA: Diagnosis not present

## 2023-12-13 DIAGNOSIS — M1711 Unilateral primary osteoarthritis, right knee: Secondary | ICD-10-CM | POA: Diagnosis not present

## 2023-12-25 ENCOUNTER — Other Ambulatory Visit: Payer: Self-pay | Admitting: Cardiology

## 2024-01-02 DIAGNOSIS — H2513 Age-related nuclear cataract, bilateral: Secondary | ICD-10-CM | POA: Diagnosis not present

## 2024-01-02 DIAGNOSIS — H53143 Visual discomfort, bilateral: Secondary | ICD-10-CM | POA: Diagnosis not present

## 2024-01-02 DIAGNOSIS — H524 Presbyopia: Secondary | ICD-10-CM | POA: Diagnosis not present

## 2024-01-14 ENCOUNTER — Other Ambulatory Visit: Payer: Self-pay | Admitting: Physician Assistant

## 2024-01-15 ENCOUNTER — Other Ambulatory Visit: Payer: Self-pay | Admitting: Cardiology

## 2024-01-15 ENCOUNTER — Encounter

## 2024-01-15 DIAGNOSIS — E7800 Pure hypercholesterolemia, unspecified: Secondary | ICD-10-CM

## 2024-01-18 ENCOUNTER — Encounter

## 2024-01-29 DIAGNOSIS — M5032 Other cervical disc degeneration, mid-cervical region, unspecified level: Secondary | ICD-10-CM | POA: Diagnosis not present

## 2024-01-29 DIAGNOSIS — M9903 Segmental and somatic dysfunction of lumbar region: Secondary | ICD-10-CM | POA: Diagnosis not present

## 2024-01-29 DIAGNOSIS — M9902 Segmental and somatic dysfunction of thoracic region: Secondary | ICD-10-CM | POA: Diagnosis not present

## 2024-01-29 DIAGNOSIS — M9901 Segmental and somatic dysfunction of cervical region: Secondary | ICD-10-CM | POA: Diagnosis not present

## 2024-01-31 ENCOUNTER — Encounter: Admitting: Gastroenterology

## 2024-02-01 ENCOUNTER — Encounter: Admitting: Gastroenterology

## 2024-02-05 DIAGNOSIS — M9903 Segmental and somatic dysfunction of lumbar region: Secondary | ICD-10-CM | POA: Diagnosis not present

## 2024-02-05 DIAGNOSIS — M9901 Segmental and somatic dysfunction of cervical region: Secondary | ICD-10-CM | POA: Diagnosis not present

## 2024-02-05 DIAGNOSIS — M5032 Other cervical disc degeneration, mid-cervical region, unspecified level: Secondary | ICD-10-CM | POA: Diagnosis not present

## 2024-02-05 DIAGNOSIS — M9902 Segmental and somatic dysfunction of thoracic region: Secondary | ICD-10-CM | POA: Diagnosis not present

## 2024-02-08 DIAGNOSIS — M9902 Segmental and somatic dysfunction of thoracic region: Secondary | ICD-10-CM | POA: Diagnosis not present

## 2024-02-08 DIAGNOSIS — M5032 Other cervical disc degeneration, mid-cervical region, unspecified level: Secondary | ICD-10-CM | POA: Diagnosis not present

## 2024-02-08 DIAGNOSIS — M9903 Segmental and somatic dysfunction of lumbar region: Secondary | ICD-10-CM | POA: Diagnosis not present

## 2024-02-08 DIAGNOSIS — M9901 Segmental and somatic dysfunction of cervical region: Secondary | ICD-10-CM | POA: Diagnosis not present

## 2024-02-22 DIAGNOSIS — M9901 Segmental and somatic dysfunction of cervical region: Secondary | ICD-10-CM | POA: Diagnosis not present

## 2024-02-22 DIAGNOSIS — M5032 Other cervical disc degeneration, mid-cervical region, unspecified level: Secondary | ICD-10-CM | POA: Diagnosis not present

## 2024-02-22 DIAGNOSIS — M9902 Segmental and somatic dysfunction of thoracic region: Secondary | ICD-10-CM | POA: Diagnosis not present

## 2024-02-22 DIAGNOSIS — M9903 Segmental and somatic dysfunction of lumbar region: Secondary | ICD-10-CM | POA: Diagnosis not present

## 2024-03-03 ENCOUNTER — Telehealth: Payer: Self-pay

## 2024-03-03 ENCOUNTER — Encounter

## 2024-03-03 NOTE — Telephone Encounter (Signed)
 Telephone calls placed X2 to complete PV appt, VM obtained each time and messages left.  Last message left informing patient to please call back before end of day to reschedule PV appointment to avoid colonoscopy cancellation. If PV appointment is not rescheduled, colonoscopy will be cancelled and no show letter will be sent to patient.

## 2024-03-04 DIAGNOSIS — M9902 Segmental and somatic dysfunction of thoracic region: Secondary | ICD-10-CM | POA: Diagnosis not present

## 2024-03-04 DIAGNOSIS — M5032 Other cervical disc degeneration, mid-cervical region, unspecified level: Secondary | ICD-10-CM | POA: Diagnosis not present

## 2024-03-04 DIAGNOSIS — M9901 Segmental and somatic dysfunction of cervical region: Secondary | ICD-10-CM | POA: Diagnosis not present

## 2024-03-04 DIAGNOSIS — M9903 Segmental and somatic dysfunction of lumbar region: Secondary | ICD-10-CM | POA: Diagnosis not present

## 2024-03-10 ENCOUNTER — Other Ambulatory Visit: Payer: Self-pay | Admitting: Internal Medicine

## 2024-03-11 DIAGNOSIS — M5032 Other cervical disc degeneration, mid-cervical region, unspecified level: Secondary | ICD-10-CM | POA: Diagnosis not present

## 2024-03-11 DIAGNOSIS — M9902 Segmental and somatic dysfunction of thoracic region: Secondary | ICD-10-CM | POA: Diagnosis not present

## 2024-03-11 DIAGNOSIS — M9901 Segmental and somatic dysfunction of cervical region: Secondary | ICD-10-CM | POA: Diagnosis not present

## 2024-03-11 DIAGNOSIS — M9903 Segmental and somatic dysfunction of lumbar region: Secondary | ICD-10-CM | POA: Diagnosis not present

## 2024-03-20 ENCOUNTER — Other Ambulatory Visit

## 2024-03-20 ENCOUNTER — Ambulatory Visit: Payer: Medicare Other | Admitting: Internal Medicine

## 2024-03-20 ENCOUNTER — Encounter: Payer: Self-pay | Admitting: Internal Medicine

## 2024-03-20 VITALS — BP 120/60 | HR 62 | Ht 64.0 in | Wt 180.0 lb

## 2024-03-20 DIAGNOSIS — E063 Autoimmune thyroiditis: Secondary | ICD-10-CM | POA: Diagnosis not present

## 2024-03-20 LAB — T3, FREE: T3, Free: 5.4 pg/mL — ABNORMAL HIGH (ref 2.3–4.2)

## 2024-03-20 LAB — TSH: TSH: 3.02 m[IU]/L (ref 0.40–4.50)

## 2024-03-20 LAB — T4, FREE: Free T4: 1 ng/dL (ref 0.8–1.8)
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
## 2024-03-20 NOTE — Patient Instructions (Signed)
Please continue Armour 60 mg daily  Take the thyroid hormone every day, with water, at least 30 minutes before breakfast, separated by at least 4 hours from: - acid reflux medications - calcium - iron - multivitamins  Please stop at the lab.  Please return in 1 year.

## 2024-03-21 ENCOUNTER — Ambulatory Visit: Payer: Self-pay | Admitting: Internal Medicine

## 2024-03-21 ENCOUNTER — Encounter: Payer: Self-pay | Admitting: Internal Medicine

## 2024-03-21 MED ORDER — ARMOUR THYROID 60 MG PO TABS: 60.0000 mg | ORAL_TABLET | Freq: Every day | ORAL | 3 refills | Status: AC

## 2024-03-21 NOTE — Addendum Note (Signed)
 Addended by: TRIXIE FILE on: 03/21/2024 05:15 PM   Modules accepted: Orders

## 2024-03-28 ENCOUNTER — Encounter: Admitting: Gastroenterology

## 2024-04-16 ENCOUNTER — Telehealth: Payer: Self-pay

## 2024-04-16 NOTE — Telephone Encounter (Signed)
 In preparing patients chart for PV I noticed that the patient is taking Wegovy . Patients PV is scheduled for 04/21/24. The last dose of Wegovy  needs to be 04/20/24. Patient was called to tell her not to take Wegovy  after 04/20/24. No answer. A message was left to inform the patient of instructions.   Madeline Shams, LPN ( PV )

## 2024-04-21 ENCOUNTER — Encounter: Payer: Self-pay | Admitting: Gastroenterology

## 2024-04-21 ENCOUNTER — Ambulatory Visit: Admitting: *Deleted

## 2024-04-21 VITALS — Ht 64.0 in | Wt 180.0 lb

## 2024-04-21 DIAGNOSIS — Z860100 Personal history of colon polyps, unspecified: Secondary | ICD-10-CM

## 2024-04-21 DIAGNOSIS — Z800 Family history of malignant neoplasm of digestive organs: Secondary | ICD-10-CM

## 2024-04-21 MED ORDER — NA SULFATE-K SULFATE-MG SULF 17.5-3.13-1.6 GM/177ML PO SOLN: 1.0000 | Freq: Once | ORAL | 0 refills | Status: AC

## 2024-04-21 NOTE — Progress Notes (Signed)
 Pt's name and DOB verified at the beginning of the pre-visit with 2 identifiers  Pt denies any difficulty with ambulating,sitting, laying down or rolling side to side  Pt has no issues moving head neck or swallowing  No egg or soy allergy known to patient   No issues known to pt with past sedation  No FH of Malignant Hyperthermia  Pt is not on home 02   Pt is not on blood thinners   Pt denies issues with constipation   Pt is not on dialysis  Pt denise any abnormal heart rhythms   Pt denies any upcoming cardiac testing  Patient's chart reviewed by Norleen Schillings CNRA prior to pre-visit and patient appropriate for the [facility].  Pre-visit completed and red dot placed by patient's name on their procedure day (on provider's schedule).  Visit by phon  Pt states weight is 180 lb   Pt given  both [facility] main # and MD on call # prior to instructions.  Informed pt to come in at the time discussed and is shown on PV instructions.  Pt instructed to use [employer] or GoodRx for a price reduction on prep  Instructed pt where to find PV instructions in My Ch. Copy of instructions  to be sent in mail and address read back to pt to verify correct on envelope. Instructed pt on all aspects of written instructions including med holds clothing to wear and foods to eat and not eat as well as after procedure legal restrictions and to call MD on call if needed.. Pt states understanding. Instructed pt to review instructions again prior to procedure and call main # given if has any questions or any issues. Pt states they will.

## 2024-04-24 ENCOUNTER — Telehealth: Payer: Self-pay | Admitting: Gastroenterology

## 2024-04-24 NOTE — Telephone Encounter (Signed)
 RN responded to patient question via My Chart, as patient stated that would be an acceptable means of communication.

## 2024-04-24 NOTE — Telephone Encounter (Signed)
 Inbound call from patient stating that she would like to speak to the nurse in regards to what kind of potatoes she is allowed to eat or is just potatoes in general. Patient would like either a text message back or a my chat message. Please advise.

## 2024-04-28 ENCOUNTER — Ambulatory Visit (AMBULATORY_SURGERY_CENTER): Admitting: Gastroenterology

## 2024-04-28 ENCOUNTER — Encounter: Payer: Self-pay | Admitting: Gastroenterology

## 2024-04-28 VITALS — BP 129/65 | HR 66 | Temp 97.9°F | Resp 15 | Ht 64.0 in | Wt 180.0 lb

## 2024-04-28 DIAGNOSIS — Z1211 Encounter for screening for malignant neoplasm of colon: Secondary | ICD-10-CM | POA: Diagnosis not present

## 2024-04-28 DIAGNOSIS — K5730 Diverticulosis of large intestine without perforation or abscess without bleeding: Secondary | ICD-10-CM

## 2024-04-28 DIAGNOSIS — K648 Other hemorrhoids: Secondary | ICD-10-CM

## 2024-04-28 DIAGNOSIS — Z860100 Personal history of colon polyps, unspecified: Secondary | ICD-10-CM

## 2024-04-28 DIAGNOSIS — D120 Benign neoplasm of cecum: Secondary | ICD-10-CM | POA: Diagnosis not present

## 2024-04-28 DIAGNOSIS — Z860101 Personal history of adenomatous and serrated colon polyps: Secondary | ICD-10-CM | POA: Diagnosis not present

## 2024-04-28 MED ORDER — SODIUM CHLORIDE 0.9 % IV SOLN: 500.0000 mL | Freq: Once | INTRAVENOUS | Status: AC

## 2024-04-28 NOTE — Progress Notes (Signed)
 To pacu, VSS. Report to Rn.tb

## 2024-04-28 NOTE — Progress Notes (Signed)
 Pt's states no medical or surgical changes since previsit or office visit.

## 2024-04-28 NOTE — Patient Instructions (Addendum)
 YOU HAD AN ENDOSCOPIC PROCEDURE TODAY AT THE [facility]:   Refer to the procedure report that was given to you for any specific questions about what was found during the examination.  If the procedure report does not answer your questions, please call your gastroenterologist to clarify.  If you requested that your care partner not be given the details of your procedure findings, then the procedure report has been included in a sealed envelope for you to review at your convenience later.  YOU SHOULD EXPECT: Some feelings of bloating in the abdomen. Passage of more gas than usual.  Walking can help get rid of the air that was put into your GI tract during the procedure and reduce the bloating. If you had a lower endoscopy (such as a colonoscopy or flexible sigmoidoscopy) you may notice spotting of blood in your stool or on the toilet paper. If you underwent a bowel prep for your procedure, you may not have a normal bowel movement for a few days.  Please Note:  You might notice some irritation and congestion in your nose or some drainage.  This is from the oxygen used during your procedure.  There is no need for concern and it should clear up in a day or so.  SYMPTOMS TO REPORT IMMEDIATELY:  Following lower endoscopy (colonoscopy or flexible sigmoidoscopy):  Excessive amounts of blood in the stool  Significant tenderness or worsening of abdominal pains  Swelling of the abdomen that is new, acute  Fever of 100F or higher   For urgent or emergent issues, a gastroenterologist can be reached at any hour by calling [phone]. Do not use MyChart messaging for urgent concerns.    DIET:  We do recommend a small meal at first, but then you may proceed to your regular diet.  Drink plenty of fluids but you should avoid alcoholic beverages for 24 hours.  MEDICATIONS: Continue present medications.  FOLLOW UP: Await pathology results.Repeat colonoscopy in 5-10 years for surveillance based  on pathology results.  Educational handouts given to patient: Polyps, Diverticulosis, Hemorrhoids.  Thank you for allowing us  to provide for your healthcare needs today.  ACTIVITY:  You should plan to take it easy for the rest of today and you should NOT DRIVE or use heavy machinery until tomorrow (because of the sedation medicines used during the test).    FOLLOW UP: Our staff will call the number listed on your records the next business day following your procedure.  We will call around 7:15- 8:00 am to check on you and address any questions or concerns that you may have regarding the information given to you following your procedure. If we do not reach you, we will leave a message.     If any biopsies were taken you will be contacted by phone or by letter within the next 1-3 weeks.  Please call us  at [phone] if you have not heard about the biopsies in 3 weeks.    SIGNATURES/CONFIDENTIALITY: You and/or your care partner have signed paperwork which will be entered into your electronic medical record.  These signatures attest to the fact that that the information above on your After Visit Summary has been reviewed and is understood.  Full responsibility of the confidentiality of this discharge information lies with you and/or your care-partner.

## 2024-04-28 NOTE — Op Note (Signed)
 [facility] Patient Name: Madeline Garza Procedure Date: 04/28/2024 8:09 AM MRN: [id number] Endoscopist: Gustav ALONSO Mcgee , MD, [id number] Age: 68 Referring MD:  Date of Birth: [birth year] Gender: Female Account #: [id number] Procedure:                Colonoscopy Indications:              High risk colon cancer surveillance: Personal                            history of colonic polyps Medicines:                Monitored Anesthesia Care Procedure:                Pre-Anesthesia Assessment:                           - Prior to the procedure, a History and Physical                            was performed, and patient medications and                            allergies were reviewed. The patient's tolerance of                            previous anesthesia was also reviewed. The risks                            and benefits of the procedure and the sedation                            options and risks were discussed with the patient.                            All questions were answered, and informed consent                            was obtained. Prior Anticoagulants: The patient has                            taken no anticoagulant or antiplatelet agents. ASA                            Grade Assessment: II - A patient with mild systemic                            disease. After reviewing the risks and benefits,                            the patient was deemed in satisfactory condition to                            undergo the procedure.  After obtaining informed consent, the colonoscope                            was passed under direct vision. Throughout the                            procedure, the patient's blood pressure, pulse, and                            oxygen saturations were monitored continuously. The                            Olympus Scope SN [id number] was introduced through the                            anus and advanced to the the  cecum, identified by                            appendiceal orifice and ileocecal valve. The                            colonoscopy was performed without difficulty. The                            patient tolerated the procedure well. The quality                            of the bowel preparation was good. The ileocecal                            valve, appendiceal orifice, and rectum were                            photographed. Scope In: 8:13:22 AM Scope Out: 8:31:15 AM Scope Withdrawal Time: 0 hours 9 minutes 45 seconds  Total Procedure Duration: 0 hours 17 minutes 53 seconds  Findings:                 The perianal and digital rectal examinations were                            normal.                           A 7 mm polyp was found in the cecum. The polyp was                            sessile. The polyp was removed with a cold snare.                            Resection and retrieval were complete.                           A few small-mouthed diverticula were found in the  sigmoid colon.                           Non-bleeding external and internal hemorrhoids were                            found during retroflexion. The hemorrhoids were                            small. Complications:            No immediate complications. Estimated Blood Loss:     Estimated blood loss was minimal. Impression:               - One 7 mm polyp in the cecum, removed with a cold                            snare. Resected and retrieved.                           - Diverticulosis in the sigmoid colon.                           - Non-bleeding external and internal hemorrhoids. Recommendation:           - Patient has a contact number available for                            emergencies. The signs and symptoms of potential                            delayed complications were discussed with the                            patient. Return to normal activities tomorrow.                             Written discharge instructions were provided to the                            patient.                           - Resume previous diet.                           - Continue present medications.                           - Await pathology results.                           - Repeat colonoscopy in 5-10 years for surveillance                            based on pathology results. Dalphine Cowie V. Shawne Bulow, MD 04/28/2024 8:40:49 AM This report has been signed electronically.

## 2024-04-28 NOTE — Progress Notes (Signed)
 Called to room to assist during endoscopic procedure.  Patient ID and intended procedure confirmed with present staff. Received instructions for my participation in the procedure from the performing physician.
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
## 2024-04-29 ENCOUNTER — Telehealth: Payer: Self-pay | Admitting: *Deleted

## 2024-04-29 NOTE — Telephone Encounter (Signed)
  Follow up Call-     04/28/2024    7:21 AM  Call back number  Post procedure Call Back phone  # [phone]  Permission to leave phone message Yes     Patient questions:  Do you have a fever, pain , or abdominal swelling? No. Pain Score  0 *  Have you tolerated food without any problems? Yes.    Have you been able to return to your normal activities? Yes.    Do you have any questions about your discharge instructions: Diet   No. Medications  No. Follow up visit  No.  Do you have questions or concerns about your Care? No.  Actions: * If pain score is 4 or above: No action needed, pain <4.

## 2024-05-02 ENCOUNTER — Ambulatory Visit: Payer: Self-pay | Admitting: Gastroenterology

## 2024-05-02 LAB — SURGICAL PATHOLOGY

## 2024-05-27 ENCOUNTER — Other Ambulatory Visit: Payer: Self-pay | Admitting: Cardiology

## 2024-05-28 MED ORDER — EZETIMIBE 10 MG PO TABS: 10.0000 mg | ORAL_TABLET | Freq: Every day | ORAL | 0 refills | Status: AC

## 2024-07-01 ENCOUNTER — Other Ambulatory Visit: Payer: Self-pay | Admitting: Cardiology

## 2025-03-23 ENCOUNTER — Ambulatory Visit: Admitting: Internal Medicine
